# Patient Record
Sex: Male | Born: 1950 | Race: White | Hispanic: No | Marital: Single | Smoking: Current every day smoker
Health system: Southern US, Community
[De-identification: ages and names within clinical notes are randomized; demographics above are authoritative.]

## PROBLEM LIST (undated history)

## (undated) DIAGNOSIS — F329 Major depressive disorder, single episode, unspecified: Secondary | ICD-10-CM

## (undated) DIAGNOSIS — F32A Depression, unspecified: Secondary | ICD-10-CM

## (undated) DIAGNOSIS — E0590 Thyrotoxicosis, unspecified without thyrotoxic crisis or storm: Secondary | ICD-10-CM

---

## 2018-04-22 ENCOUNTER — Emergency Department (HOSPITAL_COMMUNITY)
Admission: EM | Admit: 2018-04-22 | Discharge: 2018-04-24 | Disposition: A | Discharge status: Other Acute Inpatient Hospital | Payer: Medicare (Managed Care) | Attending: Emergency Medicine | Admitting: Emergency Medicine

## 2018-04-22 ENCOUNTER — Encounter (HOSPITAL_COMMUNITY): Payer: Self-pay | Admitting: Emergency Medicine

## 2018-04-22 DIAGNOSIS — F314 Bipolar disorder, current episode depressed, severe, without psychotic features: Secondary | ICD-10-CM | POA: Diagnosis not present

## 2018-04-22 DIAGNOSIS — R45851 Suicidal ideations: Secondary | ICD-10-CM | POA: Insufficient documentation

## 2018-04-22 DIAGNOSIS — Z79899 Other long term (current) drug therapy: Secondary | ICD-10-CM | POA: Insufficient documentation

## 2018-04-22 DIAGNOSIS — D709 Neutropenia, unspecified: Secondary | ICD-10-CM | POA: Diagnosis not present

## 2018-04-22 DIAGNOSIS — F332 Major depressive disorder, recurrent severe without psychotic features: Secondary | ICD-10-CM | POA: Diagnosis present

## 2018-04-22 DIAGNOSIS — F329 Major depressive disorder, single episode, unspecified: Secondary | ICD-10-CM | POA: Diagnosis present

## 2018-04-22 HISTORY — DX: Major depressive disorder, single episode, unspecified: F32.9

## 2018-04-22 HISTORY — DX: Depression, unspecified: F32.A

## 2018-04-22 LAB — COMPREHENSIVE METABOLIC PANEL
ALBUMIN: 4.4 g/dL (ref 3.5–5.0)
ALT: 12 U/L (ref 0–44)
ANION GAP: 12 (ref 5–15)
AST: 20 U/L (ref 15–41)
Alkaline Phosphatase: 73 U/L (ref 38–126)
BILIRUBIN TOTAL: 0.5 mg/dL (ref 0.3–1.2)
BUN: 8 mg/dL (ref 8–23)
CHLORIDE: 98 mmol/L (ref 98–111)
CO2: 25 mmol/L (ref 22–32)
Calcium: 9.4 mg/dL (ref 8.9–10.3)
Creatinine, Ser: 0.81 mg/dL (ref 0.61–1.24)
GFR calc Af Amer: >60 mL/min (ref >60)
GFR calc non Af Amer: >60 mL/min (ref >60)
GLUCOSE: 103 mg/dL — AB (ref 70–99)
POTASSIUM: 3.8 mmol/L (ref 3.5–5.1)
SODIUM: 135 mmol/L (ref 135–145)
TOTAL PROTEIN: 7.6 g/dL (ref 6.5–8.1)

## 2018-04-22 LAB — RAPID URINE DRUG SCREEN, HOSP PERFORMED
Amphetamines: NOT DETECTED
BARBITURATES: NOT DETECTED
Benzodiazepines: NOT DETECTED
COCAINE: NOT DETECTED
OPIATES: NOT DETECTED
Tetrahydrocannabinol: NOT DETECTED

## 2018-04-22 LAB — ACETAMINOPHEN LEVEL

## 2018-04-22 LAB — CBC
HCT: 36.4 % — ABNORMAL LOW (ref 39.0–52.0)
Hemoglobin: 11.8 g/dL — ABNORMAL LOW (ref 13.0–17.0)
MCH: 25.3 pg — ABNORMAL LOW (ref 26.0–34.0)
MCHC: 32.4 g/dL (ref 30.0–36.0)
MCV: 78.1 fL (ref 78.0–100.0)
PLATELETS: 261 10*3/uL (ref 150–400)
RBC: 4.66 MIL/uL (ref 4.22–5.81)
RDW: 17.1 % — AB (ref 11.5–15.5)
WBC: 2.2 10*3/uL — ABNORMAL LOW (ref 4.0–10.5)

## 2018-04-22 LAB — ETHANOL

## 2018-04-22 LAB — SALICYLATE LEVEL: Salicylate Lvl: <7 mg/dL (ref 2.8–30.0)

## 2018-04-22 LAB — I-STAT TROPONIN, ED: Troponin i, poc: 0.01 ng/mL (ref 0.00–0.08)

## 2018-04-22 MED ORDER — CITALOPRAM HYDROBROMIDE 10 MG PO TABS
40.0000 mg | ORAL_TABLET | Freq: Every day | ORAL | Status: DC
  Administered 2018-04-22 – 2018-04-23 (×2): 40 mg via ORAL
  Filled 2018-04-22 (×2): qty 4

## 2018-04-22 MED ORDER — MIRTAZAPINE 30 MG PO TABS
15.0000 mg | ORAL_TABLET | Freq: Every day | ORAL | Status: DC
  Administered 2018-04-22 – 2018-04-23 (×2): 15 mg via ORAL
  Filled 2018-04-22 (×2): qty 1

## 2018-04-22 MED ORDER — LISINOPRIL 20 MG PO TABS
20.0000 mg | ORAL_TABLET | Freq: Every day | ORAL | Status: DC
  Administered 2018-04-23 – 2018-04-24 (×2): 20 mg via ORAL
  Filled 2018-04-22 (×2): qty 1

## 2018-04-22 MED ORDER — TRAMADOL HCL 50 MG PO TABS
50.0000 mg | ORAL_TABLET | Freq: Three times a day (TID) | ORAL | Status: DC | PRN
  Administered 2018-04-22 – 2018-04-24 (×3): 50 mg via ORAL
  Filled 2018-04-22 (×3): qty 1

## 2018-04-22 MED ORDER — LEVOTHYROXINE SODIUM 100 MCG PO TABS
100.0000 ug | ORAL_TABLET | Freq: Every day | ORAL | Status: DC
  Administered 2018-04-23 – 2018-04-24 (×2): 100 ug via ORAL
  Filled 2018-04-22 (×2): qty 1

## 2018-04-22 MED ORDER — DICLOFENAC SODIUM 50 MG PO TBEC
50.0000 mg | DELAYED_RELEASE_TABLET | Freq: Two times a day (BID) | ORAL | Status: DC
  Administered 2018-04-22 – 2018-04-24 (×4): 50 mg via ORAL
  Filled 2018-04-22 (×5): qty 1

## 2018-04-22 MED ORDER — PANTOPRAZOLE SODIUM 40 MG PO TBEC
40.0000 mg | DELAYED_RELEASE_TABLET | Freq: Two times a day (BID) | ORAL | Status: DC
  Administered 2018-04-22 – 2018-04-24 (×4): 40 mg via ORAL
  Filled 2018-04-22 (×4): qty 1

## 2018-04-22 MED ORDER — GABAPENTIN 400 MG PO CAPS
800.0000 mg | ORAL_CAPSULE | Freq: Three times a day (TID) | ORAL | Status: DC
  Administered 2018-04-22 – 2018-04-24 (×7): 800 mg via ORAL
  Filled 2018-04-22 (×9): qty 2

## 2018-04-22 MED ORDER — GABAPENTIN 800 MG PO TABS
800.0000 mg | ORAL_TABLET | Freq: Three times a day (TID) | ORAL | Status: DC
  Filled 2018-04-22 (×2): qty 1

## 2018-04-22 NOTE — ED Notes (Signed)
Pt stated "I'm from [place], went to [place] with a girl but that didn't work out, she kicked me out, and have been in a halfway house here with 5 guys.  There's more drugs and alochol in there than outside.  I don't have any place to go.  I have an ulcer."

## 2018-04-22 NOTE — ED Notes (Signed)
Pt requesting home meds to include gabapentin, tramadol, etc. Dr. Adela LankFloyd made aware. New orders placed.

## 2018-04-22 NOTE — ED Provider Notes (Signed)
[facility]-EMERGENCY DEPT Provider Note   CSN: [id number] Arrival date & time: 04/22/18  0759     History   Chief Complaint Chief Complaint  Patient presents with  . Suicidal    HPI Walter Nicholson is a 67 y.o. male.  Patient is a 67 year old male with a history of depression who presents with worsening depression and suicidal ideations.  He states he is been markedly worse regarding his depression over the last 2 to 3 weeks.  He feels like he has no will to live.  He is not sleeping or sleeping for 14 hours a day.  He says he is not eating very well.  He is currently living with 5 other men and says that he can go back to that environment.  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  He feels overall anxious and jittery.  He does complain of some chest pain which he says goes on all day every day but denies any other physical complaints or recent illnesses.     Past Medical History:  Diagnosis Date  . Depression     There are no active problems to display for this patient.    The histories are not reviewed yet. Please review them in the "History" navigator section and refresh this SmartLink.      Home Medications    Prior to Admission medications   Medication Sig Start Date End Date Taking? Authorizing Provider  calcium carbonate (TUMS EX) 750 MG chewable tablet Chew 1 tablet by mouth daily as needed for heartburn.   Yes [provider]  citalopram (CELEXA) 40 MG tablet Take 40 mg by mouth daily.   Yes [provider]  diclofenac (VOLTAREN) 50 MG EC tablet Take 50 mg by mouth 2 (two) times daily.   Yes [provider]  gabapentin (NEURONTIN) 800 MG tablet Take 800 mg by mouth 3 (three) times daily.   Yes [provider]  lamoTRIgine (LAMICTAL) 100 MG tablet Take 100 mg by mouth 2 (two) times daily.   Yes [provider]  levothyroxine (SYNTHROID, LEVOTHROID) 100 MCG  tablet Take 100 mcg by mouth daily before breakfast.   Yes [provider]  lisinopril (PRINIVIL,ZESTRIL) 20 MG tablet Take 20 mg by mouth daily.   Yes [provider]  mirtazapine (REMERON) 15 MG tablet Take 15 mg by mouth at bedtime.   Yes [provider]  Multiple Vitamins-Minerals (CENTRUM ADULTS PO) Take 1 tablet by mouth daily.   Yes [provider]  naproxen sodium (ALEVE) 220 MG tablet Take 440 mg by mouth daily as needed (for pain or headache).   Yes [provider]  pantoprazole (PROTONIX) 40 MG tablet Take 40 mg by mouth 2 (two) times daily.   Yes [provider]  sucralfate (CARAFATE) 1 g tablet Take 1 g by mouth 4 (four) times daily -  with meals and at bedtime.   Yes [provider]  traMADol (ULTRAM) 50 MG tablet Take 50 mg by mouth 3 (three) times daily as needed for moderate pain.   Yes [provider]    Family History No family history on file.  Social History Social History   Tobacco Use  . Smoking status: Not on file  Substance Use Topics  . Alcohol use: Yes  . Drug use: Not on file     Allergies   Patient has no known allergies.   Review of Systems Review  of Systems  Constitutional: Positive for fatigue. Negative for chills, diaphoresis and fever.  HENT: Negative for congestion, rhinorrhea and sneezing.   Eyes: Negative.   Respiratory: Negative for cough, chest tightness and shortness of breath.   Cardiovascular: Positive for chest pain. Negative for leg swelling.  Gastrointestinal: Negative for abdominal pain, blood in stool, diarrhea, nausea and vomiting.  Genitourinary: Negative for difficulty urinating, flank pain, frequency and hematuria.  Musculoskeletal: Negative for arthralgias and back pain.  Skin: Negative for rash.  Neurological: Negative for dizziness, speech difficulty, weakness, numbness and headaches.  Psychiatric/Behavioral: Positive for dysphoric mood and suicidal  ideas. The patient is nervous/anxious.      Physical Exam Updated Vital Signs BP 140/77 (BP Location: Right Arm)   Pulse (!) 105   Temp 98.1 F (36.7 C) (Oral)   Resp 20   SpO2 97%   Physical Exam  Constitutional: He is oriented to person, place, and time. He appears well-developed and well-nourished.  HENT:  Head: Normocephalic and atraumatic.  Eyes: Pupils are equal, round, and reactive to light.  Neck: Normal range of motion. Neck supple.  Cardiovascular: Normal rate, regular rhythm and normal heart sounds.  Pulmonary/Chest: Effort normal and breath sounds normal. No respiratory distress. He has no wheezes. He has no rales. He exhibits no tenderness.  Abdominal: Soft. Bowel sounds are normal. There is no tenderness. There is no rebound and no guarding.  Musculoskeletal: Normal range of motion. He exhibits no edema.  Lymphadenopathy:    He has no cervical adenopathy.  Neurological: He is alert and oriented to person, place, and time.  Skin: Skin is warm and dry. No rash noted.  Psychiatric: He has a normal mood and affect.     ED Treatments / Results  Labs (all labs ordered are listed, but only abnormal results are displayed) Labs Reviewed  COMPREHENSIVE METABOLIC PANEL - Abnormal; Notable for the following components:      Result Value   Glucose, Bld 103 (*)    All other components within normal limits  ACETAMINOPHEN LEVEL - Abnormal; Notable for the following components:   Acetaminophen (Tylenol), Serum <10 (*)    All other components within normal limits  CBC - Abnormal; Notable for the following components:   WBC 2.2 (*)    Hemoglobin 11.8 (*)    HCT 36.4 (*)    MCH 25.3 (*)    RDW 17.1 (*)    All other components within normal limits  ETHANOL  SALICYLATE LEVEL  RAPID URINE DRUG SCREEN, HOSP PERFORMED  I-STAT TROPONIN, ED    EKG EKG Interpretation  Date/Time:  Sunday April 22 2018 09:24:32 EDT Ventricular Rate:  81 PR Interval:    QRS  Duration: 83 QT Interval:  399 QTC Calculation: 464 R Axis:   74 Text Interpretation:  Sinus rhythm Consider left ventricular hypertrophy No old tracing to compare Confirmed by Rolan Bucco [phone]) on 04/22/2018 9:33:13 AM   Radiology No results found.  Procedures Procedures (including critical care time)  Medications Ordered in ED Medications - No data to display   Initial Impression / Assessment and Plan / ED Course  I have reviewed the triage vital signs and the nursing notes.  Pertinent labs & imaging results that were available during my care of the patient were reviewed by me and considered in my medical decision making (see chart for details).     PT is medically cleared and awaiting TTS evaluation.  He will need outpatient follow-up regarding his leukopenia.  This may be related to his ETOH use.   Final Clinical Impressions(s) / ED Diagnoses   Final diagnoses:  Suicidal thoughts  Neutropenia, unspecified type [facility](HCC)    ED Discharge Orders    None       Rolan BuccoBelfi, Dorleen Kissel, MD 04/22/18 1208

## 2018-04-22 NOTE — ED Notes (Signed)
Pt stated "It would be really easy to get a sharp knife and slice my throat."

## 2018-04-22 NOTE — ED Notes (Signed)
Bed: WA28 Expected date:  Expected time:  Means of arrival:  Comments: 

## 2018-04-22 NOTE — ED Notes (Signed)
Pt up OOB, unsteady when attempting to move to recliner.

## 2018-04-22 NOTE — ED Notes (Signed)
Bed: [facility] Expected date:  Expected time:  Means of arrival:  Comments: 

## 2018-04-22 NOTE — BHH Counselor (Signed)
Dr. Jannifer FranklinAkintayo and Elta GuadeloupeLaurie Parks, NP, recommend observe overnight and reassess in the morning.
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
## 2018-04-22 NOTE — ED Triage Notes (Signed)
Pt brought in by [employer], pt had called police stating he was suicidal and depressed. Pt states he will overdose on drugs / alcohol or jump into traffic. Pt calm and cooperative in triage.

## 2018-04-23 ENCOUNTER — Encounter (HOSPITAL_COMMUNITY): Payer: Self-pay | Admitting: Emergency Medicine

## 2018-04-23 DIAGNOSIS — F332 Major depressive disorder, recurrent severe without psychotic features: Secondary | ICD-10-CM | POA: Diagnosis present

## 2018-04-23 DIAGNOSIS — R45851 Suicidal ideations: Secondary | ICD-10-CM

## 2018-04-23 MED ORDER — CITALOPRAM HYDROBROMIDE 10 MG PO TABS
20.0000 mg | ORAL_TABLET | Freq: Every day | ORAL | Status: DC
  Administered 2018-04-24: 20 mg via ORAL
  Filled 2018-04-23: qty 2

## 2018-04-23 NOTE — Consult Note (Signed)
[facility] Consult   Reason for Consult:  Suicidal ideations Referring Physician:  EDP Patient Identification: Walter Nicholson MRN:  [id number] Principal Diagnosis: Major depressive disorder, recurrent, severe without psychotic features [facility]) Diagnosis:   Patient Active Problem List   Diagnosis Date Noted  . Major depressive disorder, recurrent, severe without psychotic features ([place]) [F33.2] 04/23/2018    Priority: High    Total Time spent with patient: 45 minutes  Subjective:   Walter Nicholson is a 67 y.o. male patient admitted with suicidal ideations.  HPI:  67 yo male who presented to the ED with suicidal ideations, vague plan.  His depression started when he moved to [place] to a recovery house a few days ago and does not like it, feels like prison.  Feelings of hopelessness, 8/10 depression.  Past substance abuse but none currently.  Patient calmly watching television and eating without distress.  Big issue appears to be housing, discussed looking for a geriatric psych today and may need to consider outpatient tomorrow if a place not secured.  Past Psychiatric History: depression, substance abuse  Risk to Self: None Risk to Others: Homicidal Ideation: No Thoughts of Harm to Others: No Current Homicidal Intent: No Current Homicidal Plan: No Access to Homicidal Means: No Identified Victim: n/a History of harm to others?: No Assessment of Violence: None Noted Violent Behavior Description: None Noted  Does patient have access to weapons?: No Criminal Charges Pending?: No Does patient have a court date: No Prior Inpatient Therapy: Prior Inpatient Therapy: Yes Prior Therapy Dates: report hx of inpt starting in 1993 Prior Therapy Facilty/Provider(s): multiple  Reason for Treatment: mental health, depression, Bipolar  Prior Outpatient Therapy: Prior Outpatient Therapy: No Does patient have an ACCT team?: No Does patient have Intensive In-House Services?  :  No Does patient have [facility] services? : No Does patient have P4CC services?: No  Past Medical History:  Past Medical History:  Diagnosis Date  . Depression   Family History: No family history on file. Family Psychiatric  History: none Social History:  Social History   Substance and Sexual Activity  Alcohol Use Yes     Social History   Substance and Sexual Activity  Drug Use Not on file    Social History   Socioeconomic History  . Marital status: Single    Spouse name: Not on file  . Number of children: Not on file  . Years of education: Not on file  . Highest education level: Not on file  Occupational History  . Not on file  Social Needs  . Financial resource strain: Not on file  . Food insecurity:    Worry: Not on file    Inability: Not on file  . Transportation needs:    Medical: Not on file    Non-medical: Not on file  Tobacco Use  . Smoking status: Not on file  Substance and Sexual Activity  . Alcohol use: Yes  . Drug use: Not on file  . Sexual activity: Not on file  Lifestyle  . Physical activity:    Days per week: Not on file    Minutes per session: Not on file  . Stress: Not on file  Relationships  . Social connections:    Talks on phone: Not on file    Gets together: Not on file    Attends religious service: Not on file    Active member of club or organization: Not on file    Attends meetings of clubs  or organizations: Not on file    Relationship status: Not on file  Other Topics Concern  . Not on file  Social History Narrative  . Not on file   Additional Social History:    Allergies:  No Known Allergies  Labs:  Results for orders placed or performed during the hospital encounter of 04/22/18 (from the past 48 hour(s))  Rapid urine drug screen (hospital performed)     Status: None   Collection Time: 04/22/18  8:28 AM  Result Value Ref Range   Opiates NONE DETECTED NONE DETECTED   Cocaine NONE DETECTED NONE DETECTED   Benzodiazepines  NONE DETECTED NONE DETECTED   Amphetamines NONE DETECTED NONE DETECTED   Tetrahydrocannabinol NONE DETECTED NONE DETECTED   Barbiturates NONE DETECTED NONE DETECTED    Comment: (NOTE) DRUG SCREEN FOR MEDICAL PURPOSES ONLY.  IF CONFIRMATION IS NEEDED FOR ANY PURPOSE, NOTIFY LAB WITHIN 5 DAYS. LOWEST DETECTABLE LIMITS FOR URINE DRUG SCREEN Drug Class                     Cutoff (ng/mL) Amphetamine and metabolites    1000 Barbiturate and metabolites    200 Benzodiazepine                 254 Tricyclics and metabolites     300 Opiates and metabolites        300 Cocaine and metabolites        300 THC                            50 Performed at [facility], [place] [address], [place], [place] [address]   Comprehensive metabolic panel     Status: Abnormal   Collection Time: 04/22/18  9:14 AM  Result Value Ref Range   Sodium 135 135 - 145 mmol/L   Potassium 3.8 3.5 - 5.1 mmol/L   Chloride 98 98 - 111 mmol/L   CO2 25 22 - 32 mmol/L   Glucose, Bld 103 (H) 70 - 99 mg/dL   BUN 8 8 - 23 mg/dL   Creatinine, Ser 0.81 0.61 - 1.24 mg/dL   Calcium 9.4 8.9 - 10.3 mg/dL   Total Protein 7.6 6.5 - 8.1 g/dL   Albumin 4.4 3.5 - 5.0 g/dL   AST 20 15 - 41 U/L   ALT 12 0 - 44 U/L   Alkaline Phosphatase 73 38 - 126 U/L   Total Bilirubin 0.5 0.3 - 1.2 mg/dL   GFR calc non Af Amer >60 >60 mL/min   GFR calc Af Amer >60 >60 mL/min    Comment: (NOTE) The eGFR has been calculated using the CKD EPI equation. This calculation has not been validated in all clinical situations. eGFR's persistently <60 mL/min signify possible Chronic Kidney Disease.    Anion gap 12 5 - 15    Comment: Performed at [facility], [place] [address] [place], [place] [address]  Ethanol     Status: None   Collection Time: 04/22/18  9:14 AM  Result Value Ref Range   Alcohol, Ethyl (B) <10 <10 mg/dL    Comment: (NOTE) Lowest detectable limit for serum alcohol is 10 mg/dL. For medical purposes  only. Performed at [facility], [place] [address], [place], [place] [address]   Salicylate level     Status: None   Collection Time: 04/22/18  9:14 AM  Result Value Ref Range   Salicylate  Lvl <7.0 2.8 - 30.0 mg/dL    Comment: Performed at [facility], [place] [address], [place], [place] [address]  Acetaminophen level     Status: Abnormal   Collection Time: 04/22/18  9:14 AM  Result Value Ref Range   Acetaminophen (Tylenol), Serum <10 (L) 10 - 30 ug/mL    Comment: (NOTE) Therapeutic concentrations vary significantly. A range of 10-30 ug/mL  may be an effective concentration for many patients. However, some  are best treated at concentrations outside of this range. Acetaminophen concentrations >150 ug/mL at 4 hours after ingestion  and >50 ug/mL at 12 hours after ingestion are often associated with  toxic reactions. Performed at [facility], [place] [address] [place], [place] [address]   cbc     Status: Abnormal   Collection Time: 04/22/18  9:14 AM  Result Value Ref Range   WBC 2.2 (L) 4.0 - 10.5 K/uL   RBC 4.66 4.22 - 5.81 MIL/uL   Hemoglobin 11.8 (L) 13.0 - 17.0 g/dL   HCT 36.4 (L) 39.0 - 52.0 %   MCV 78.1 78.0 - 100.0 fL   MCH 25.3 (L) 26.0 - 34.0 pg   MCHC 32.4 30.0 - 36.0 g/dL   RDW 17.1 (H) 11.5 - 15.5 %   Platelets 261 150 - 400 K/uL    Comment: Performed at [facility], [place] [address] [place], [place] [address]  I-stat troponin, ED     Status: None   Collection Time: 04/22/18  9:21 AM  Result Value Ref Range   Troponin i, poc 0.01 0.00 - 0.08 ng/mL   Comment 3            Comment: Due to the release kinetics of cTnI, a negative result within the first hours of the onset of symptoms does not rule out myocardial infarction with certainty. If myocardial infarction is still suspected, repeat the test at appropriate intervals.     Current Facility-Administered Medications  Medication Dose Route  Frequency Provider Last Rate Last Dose  . [START ON 04/24/2018] citalopram (CELEXA) tablet 20 mg  20 mg Oral Daily Patrecia Pour, NP      . diclofenac (VOLTAREN) EC tablet 50 mg  50 mg Oral BID Deno Etienne, DO   50 mg at 04/23/18 9147  . gabapentin (NEURONTIN) capsule 800 mg  800 mg Oral TID Malvin Johns, MD   800 mg at 04/23/18 0833  . levothyroxine (SYNTHROID, LEVOTHROID) tablet 100 mcg  100 mcg Oral QAC breakfast Deno Etienne, DO   100 mcg at 04/23/18 8295  . lisinopril (PRINIVIL,ZESTRIL) tablet 20 mg  20 mg Oral Daily Deno Etienne, DO   20 mg at 04/23/18 0834  . mirtazapine (REMERON) tablet 15 mg  15 mg Oral QHS Deno Etienne, DO   15 mg at 04/22/18 2147  . pantoprazole (PROTONIX) EC tablet 40 mg  40 mg Oral BID Deno Etienne, DO   40 mg at 04/23/18 0834  . traMADol (ULTRAM) tablet 50 mg  50 mg Oral TID PRN Deno Etienne, DO   50 mg at 04/22/18 1730   Current Outpatient Medications  Medication Sig Dispense Refill  . calcium carbonate (TUMS EX) 750 MG chewable tablet Chew 1 tablet by mouth daily as needed for heartburn.    . citalopram (CELEXA) 40 MG tablet Take 40 mg by mouth daily.    . diclofenac (VOLTAREN) 50 MG EC tablet Take 50 mg by mouth 2 (two) times daily.    Marland Kitchen  gabapentin (NEURONTIN) 800 MG tablet Take 800 mg by mouth 3 (three) times daily.    Marland Kitchen lamoTRIgine (LAMICTAL) 100 MG tablet Take 100 mg by mouth 2 (two) times daily.    Marland Kitchen levothyroxine (SYNTHROID, LEVOTHROID) 100 MCG tablet Take 100 mcg by mouth daily before breakfast.    . lisinopril (PRINIVIL,ZESTRIL) 20 MG tablet Take 20 mg by mouth daily.    . mirtazapine (REMERON) 15 MG tablet Take 15 mg by mouth at bedtime.    . Multiple Vitamins-Minerals (CENTRUM ADULTS PO) Take 1 tablet by mouth daily.    . naproxen sodium (ALEVE) 220 MG tablet Take 440 mg by mouth daily as needed (for pain or headache).    . pantoprazole (PROTONIX) 40 MG tablet Take 40 mg by mouth 2 (two) times daily.    . sucralfate (CARAFATE) 1 g tablet Take 1 g by mouth 4  (four) times daily -  with meals and at bedtime.    . traMADol (ULTRAM) 50 MG tablet Take 50 mg by mouth 3 (three) times daily as needed for moderate pain.      Musculoskeletal: Strength & Muscle Tone: within normal limits Gait & Station: normal Patient leans: N/A  Psychiatric Specialty Exam: Physical Exam  Nursing note and vitals reviewed. Constitutional: He is oriented to person, place, and time. He appears well-developed and well-nourished.  HENT:  Head: Normocephalic.  Neck: Normal range of motion.  Respiratory: Effort normal.  Musculoskeletal: Normal range of motion.  Neurological: He is alert and oriented to person, place, and time.  Psychiatric: His speech is normal and behavior is normal. Judgment normal. His mood appears anxious. He exhibits a depressed mood. He expresses suicidal ideation. He expresses suicidal plans.    Review of Systems  Psychiatric/Behavioral: Positive for depression and suicidal ideas. The patient is nervous/anxious.   All other systems reviewed and are negative.   Blood pressure (!) 167/80, pulse 68, temperature 97.8 F (36.6 C), temperature source Oral, resp. rate 18, SpO2 97 %.There is no height or weight on file to calculate BMI.  General Appearance: Casual  Eye Contact:  Good  Speech:  Normal Rate  Volume:  Normal  Mood:  Anxious and Depressed  Affect:  Congruent  Thought Process:  Coherent and Descriptions of Associations: Intact  Orientation:  Full (Time, Place, and Person)  Thought Content:  Rumination  Suicidal Thoughts:  Yes.  without intent/plan  Homicidal Thoughts:  No  Memory:  Immediate;   Fair Recent;   Fair Remote;   Fair  Judgement:  Fair  Insight:  Fair  Psychomotor Activity:  Decreased  Concentration:  Concentration: Fair and Attention Span: Fair  Recall:  Good  Fund of Knowledge:  Fair  Language:  Good  Akathisia:  No  Handed:  Right  AIMS (if indicated):     Assets:  Leisure Time Physical Health Resilience   ADL's:  Intact  Cognition:  WNL  Sleep:        Treatment Plan Summary: Daily contact with patient to assess and evaluate symptoms and progress in treatment, Medication management and Plan major depressive disorder, recurrent, severe without psychosis: -Decreased Celexa from 40 mg to 20 mg daily -Continued Remeron 15 mg daily at bedtime for depression and sleep  Disposition: Recommend psychiatric Inpatient admission when medically cleared.  Waylan Boga, NP 04/23/2018 1:46 PM

## 2018-04-24 ENCOUNTER — Encounter (HOSPITAL_COMMUNITY): Payer: Self-pay | Admitting: *Deleted

## 2018-04-24 ENCOUNTER — Emergency Department (HOSPITAL_COMMUNITY): Payer: Medicare (Managed Care)

## 2018-04-24 ENCOUNTER — Other Ambulatory Visit: Payer: Self-pay

## 2018-04-24 ENCOUNTER — Inpatient Hospital Stay (HOSPITAL_COMMUNITY)
Admission: AD | Admit: 2018-04-24 | Discharge: 2018-05-01 | DRG: 885 | Disposition: A | Discharge status: Home / Self Care | Payer: Medicare (Managed Care) | Source: Intra-hospital | Attending: Psychiatry | Admitting: Psychiatry

## 2018-04-24 DIAGNOSIS — E039 Hypothyroidism, unspecified: Secondary | ICD-10-CM | POA: Diagnosis present

## 2018-04-24 DIAGNOSIS — F17210 Nicotine dependence, cigarettes, uncomplicated: Secondary | ICD-10-CM | POA: Diagnosis present

## 2018-04-24 DIAGNOSIS — F314 Bipolar disorder, current episode depressed, severe, without psychotic features: Principal | ICD-10-CM | POA: Diagnosis present

## 2018-04-24 DIAGNOSIS — K219 Gastro-esophageal reflux disease without esophagitis: Secondary | ICD-10-CM | POA: Diagnosis present

## 2018-04-24 DIAGNOSIS — G4700 Insomnia, unspecified: Secondary | ICD-10-CM | POA: Diagnosis present

## 2018-04-24 DIAGNOSIS — F1020 Alcohol dependence, uncomplicated: Secondary | ICD-10-CM | POA: Diagnosis present

## 2018-04-24 DIAGNOSIS — R7989 Other specified abnormal findings of blood chemistry: Secondary | ICD-10-CM

## 2018-04-24 DIAGNOSIS — I10 Essential (primary) hypertension: Secondary | ICD-10-CM | POA: Diagnosis present

## 2018-04-24 DIAGNOSIS — Z79899 Other long term (current) drug therapy: Secondary | ICD-10-CM | POA: Diagnosis not present

## 2018-04-24 DIAGNOSIS — Z915 Personal history of self-harm: Secondary | ICD-10-CM | POA: Diagnosis not present

## 2018-04-24 DIAGNOSIS — R45851 Suicidal ideations: Secondary | ICD-10-CM | POA: Diagnosis present

## 2018-04-24 DIAGNOSIS — F332 Major depressive disorder, recurrent severe without psychotic features: Secondary | ICD-10-CM | POA: Diagnosis not present

## 2018-04-24 DIAGNOSIS — Z79890 Hormone replacement therapy (postmenopausal): Secondary | ICD-10-CM

## 2018-04-24 DIAGNOSIS — F419 Anxiety disorder, unspecified: Secondary | ICD-10-CM | POA: Diagnosis present

## 2018-04-24 DIAGNOSIS — Z23 Encounter for immunization: Secondary | ICD-10-CM | POA: Diagnosis not present

## 2018-04-24 DIAGNOSIS — R451 Restlessness and agitation: Secondary | ICD-10-CM | POA: Diagnosis not present

## 2018-04-24 HISTORY — DX: Thyrotoxicosis, unspecified without thyrotoxic crisis or storm: E05.90

## 2018-04-24 LAB — URINALYSIS, ROUTINE W REFLEX MICROSCOPIC
Bilirubin Urine: NEGATIVE
GLUCOSE, UA: NEGATIVE mg/dL
HGB URINE DIPSTICK: NEGATIVE
Ketones, ur: NEGATIVE mg/dL
Leukocytes, UA: NEGATIVE
Nitrite: NEGATIVE
Protein, ur: NEGATIVE mg/dL
SPECIFIC GRAVITY, URINE: 1.005 (ref 1.005–1.030)
pH: 7 (ref 5.0–8.0)

## 2018-04-24 MED ORDER — CITALOPRAM HYDROBROMIDE 20 MG PO TABS
20.0000 mg | ORAL_TABLET | Freq: Every day | ORAL | Status: DC
  Administered 2018-04-25: 20 mg via ORAL
  Filled 2018-04-24 (×4): qty 1

## 2018-04-24 MED ORDER — INFLUENZA VAC SPLIT HIGH-DOSE 0.5 ML IM SUSY
0.5000 mL | PREFILLED_SYRINGE | INTRAMUSCULAR | Status: AC
  Administered 2018-04-26: 0.5 mL via INTRAMUSCULAR
  Filled 2018-04-24: qty 0.5

## 2018-04-24 MED ORDER — PNEUMOCOCCAL VAC POLYVALENT 25 MCG/0.5ML IJ INJ
0.5000 mL | INJECTION | INTRAMUSCULAR | Status: AC
  Administered 2018-04-26: 0.5 mL via INTRAMUSCULAR

## 2018-04-24 MED ORDER — LAMOTRIGINE 200 MG PO TABS
200.0000 mg | ORAL_TABLET | Freq: Two times a day (BID) | ORAL | Status: DC
  Administered 2018-04-24 – 2018-05-01 (×14): 200 mg via ORAL
  Filled 2018-04-24 (×2): qty 1
  Filled 2018-04-24 (×2): qty 2
  Filled 2018-04-24 (×16): qty 1

## 2018-04-24 MED ORDER — ENSURE ENLIVE PO LIQD
237.0000 mL | Freq: Two times a day (BID) | ORAL | Status: DC
  Administered 2018-04-25: 237 mL via ORAL

## 2018-04-24 MED ORDER — MAGNESIUM HYDROXIDE 400 MG/5ML PO SUSP: 30.0000 mL | Freq: Every day | ORAL | Status: DC | PRN

## 2018-04-24 MED ORDER — NICOTINE 21 MG/24HR TD PT24
21.0000 mg | MEDICATED_PATCH | Freq: Every day | TRANSDERMAL | Status: DC
  Administered 2018-04-25 – 2018-05-01 (×7): 21 mg via TRANSDERMAL
  Filled 2018-04-24 (×10): qty 1

## 2018-04-24 MED ORDER — ACETAMINOPHEN 325 MG PO TABS
650.0000 mg | ORAL_TABLET | Freq: Four times a day (QID) | ORAL | Status: DC | PRN
  Administered 2018-04-24 – 2018-04-30 (×6): 650 mg via ORAL
  Filled 2018-04-24 (×6): qty 2

## 2018-04-24 MED ORDER — ALUM & MAG HYDROXIDE-SIMETH 200-200-20 MG/5ML PO SUSP: 30.0000 mL | ORAL | Status: DC | PRN

## 2018-04-24 MED ORDER — LISINOPRIL 20 MG PO TABS
20.0000 mg | ORAL_TABLET | Freq: Every day | ORAL | Status: DC
  Administered 2018-04-25 – 2018-05-01 (×7): 20 mg via ORAL
  Filled 2018-04-24 (×10): qty 1

## 2018-04-24 MED ORDER — PANTOPRAZOLE SODIUM 40 MG PO TBEC
40.0000 mg | DELAYED_RELEASE_TABLET | Freq: Two times a day (BID) | ORAL | Status: DC
  Administered 2018-04-24 – 2018-05-01 (×14): 40 mg via ORAL
  Filled 2018-04-24 (×23): qty 1

## 2018-04-24 MED ORDER — MIRTAZAPINE 15 MG PO TABS
15.0000 mg | ORAL_TABLET | Freq: Every day | ORAL | Status: DC
  Administered 2018-04-24: 15 mg via ORAL
  Filled 2018-04-24 (×4): qty 1

## 2018-04-24 MED ORDER — GABAPENTIN 400 MG PO CAPS
800.0000 mg | ORAL_CAPSULE | Freq: Three times a day (TID) | ORAL | Status: DC
  Administered 2018-04-24 – 2018-05-01 (×21): 800 mg via ORAL
  Filled 2018-04-24 (×28): qty 2

## 2018-04-24 MED ORDER — LEVOTHYROXINE SODIUM 100 MCG PO TABS
100.0000 ug | ORAL_TABLET | Freq: Every day | ORAL | Status: DC
  Administered 2018-04-25 – 2018-04-28 (×4): 100 ug via ORAL
  Filled 2018-04-24 (×6): qty 1

## 2018-04-24 MED ORDER — LAMOTRIGINE 100 MG PO TABS
200.0000 mg | ORAL_TABLET | Freq: Two times a day (BID) | ORAL | Status: DC
  Administered 2018-04-24: 200 mg via ORAL
  Filled 2018-04-24: qty 2

## 2018-04-24 NOTE — Progress Notes (Signed)
D: Pt was in dayroom upon initial approach.  Pt presents with depressed affect and mood.  He describes his day as "okay" and reports goal is to "maybe find long-term treatment."  Pt denies SI/HI, denies hallucinations, reports chronic back pain of 7/10.  Pt has been visible in milieu interacting with peers and staff appropriately.  Pt attended evening group.    A: Introduced self to pt.  Met with pt 1:1.  Actively listened to pt and offered support and encouragement. Medications administered per order.  PRN medication administered for pain.  Q15 minute safety checks maintained.  R: Pt is safe on the unit.  Pt is compliant with medications.  Pt verbally contracts for safety.  Will continue to monitor and assess.

## 2018-04-24 NOTE — BH Assessment (Addendum)
[facility]     Per Dr Sharma [name], patient continues to meet inpatient treatment criteria. [facility] can be admitted after 5 pm.

## 2018-04-24 NOTE — BHH Group Notes (Signed)
Adult Psychoeducational Group Note  Date:  04/24/2018 Time:  10:23 PM  Group Topic/Focus:  Wrap-Up Group:   The focus of this group is to help patients review their daily goal of treatment and discuss progress on daily workbooks.  Participation Level:  Active  Participation Quality:  Appropriate and Attentive  Affect:  Appropriate  Cognitive:  Alert and Appropriate  Insight: Appropriate and Good  Engagement in Group:  Engaged  Modes of Intervention:  Discussion and Education  Additional Comments:  Pt attended and participated in wrap up group this evening.Pt told writer that they have been depressed and they have been meditating to help with their depression. Pt goal is to use the resources that we have here to get in to a program that is a better fit for them.    Walter NettersOctavia A Niema Nicholson 04/24/2018, 10:23 PM

## 2018-04-24 NOTE — Progress Notes (Addendum)
[facility]  04/24/2018 11:37 AM Walter ParisJames G Stewart  MRN:  [id number] Subjective:  Walter Nicholson is a 67 y.o. male patient admitted with suicidal ideations.   Objective: Patient assessed and observed with treatment team. At this time he continues to endorse ongoing depression and suicidal thoughts. He reports his biggest stressors are substance abuse and housing.  He was discharged from substance abuse home to a halfway house. He reports his conditions continue to get worse as there are numerous drugs in the halfway house. He is eating and drinking with no disturbances, and he denies any sleeping disturbances at this time. He is observed sitting up in the chair, and is responding appropriately to staff.   Principal Problem: Major depressive disorder, recurrent, severe without psychotic features ([facility]) Diagnosis:   Patient Active Problem List   Diagnosis Date Noted  . Major depressive disorder, recurrent, severe without psychotic features ([facility]) [F33.2] 04/23/2018   Total Time spent with patient: 20 minutes  Past Psychiatric History: depression, substance abuse  Past Medical History:  Past Medical History:  Diagnosis Date  . Depression    History reviewed. No pertinent surgical history. Family History: History reviewed. No pertinent family history. Family Psychiatric  History: See HPI Social History:  Social History   Substance and Sexual Activity  Alcohol Use Yes     Social History   Substance and Sexual Activity  Drug Use Not on file    Social History   Socioeconomic History  . Marital status: Single    Spouse name: Not on file  . Number of children: Not on file  . Years of education: Not on file  . Highest education level: Not on file  Occupational History  . Not on file  Social Needs  . Financial resource strain: Not on file  . Food insecurity:    Worry: Not on file    Inability: Not on file  . Transportation needs:    Medical: Not on file     Non-medical: Not on file  Tobacco Use  . Smoking status: Not on file  Substance and Sexual Activity  . Alcohol use: Yes  . Drug use: Not on file  . Sexual activity: Not on file  Lifestyle  . Physical activity:    Days per week: Not on file    Minutes per session: Not on file  . Stress: Not on file  Relationships  . Social connections:    Talks on phone: Not on file    Gets together: Not on file    Attends religious service: Not on file    Active member of club or organization: Not on file    Attends meetings of clubs or organizations: Not on file    Relationship status: Not on file  Other Topics Concern  . Not on file  Social History Narrative  . Not on file   Additional Social History:  N/A   Pain Medications: see MAR Prescriptions: see MAR Over the Counter: see MAR History of alcohol / drug use?: Yes Longest period of sobriety (when/how long): 5 weeks  Negative Consequences of Use: Personal relationships Name of Substance 1: Alcohol 1 - Age of First Use: 5 or 6  1 - Amount (size/oz): varies  1 - Frequency: early remission  1 - Last Use / Amount: 5 weeks ago                   Sleep: Fair  Appetite:  Fair  Current Medications:  Current Facility-Administered Medications  Medication Dose Route Frequency Provider Last Rate Last Dose  . citalopram (CELEXA) tablet 20 mg  20 mg Oral Daily Charm Rings, NP   20 mg at 04/24/18 0913  . diclofenac (VOLTAREN) EC tablet 50 mg  50 mg Oral BID Melene Plan, DO   50 mg at 04/24/18 0914  . gabapentin (NEURONTIN) capsule 800 mg  800 mg Oral TID Rolan Bucco, MD   800 mg at 04/24/18 0914  . levothyroxine (SYNTHROID, LEVOTHROID) tablet 100 mcg  100 mcg Oral QAC breakfast Melene Plan, DO   100 mcg at 04/24/18 0724  . lisinopril (PRINIVIL,ZESTRIL) tablet 20 mg  20 mg Oral Daily Melene Plan, DO   20 mg at 04/24/18 0913  . mirtazapine (REMERON) tablet 15 mg  15 mg Oral QHS Melene Plan, DO   15 mg at 04/23/18 2241  . pantoprazole  (PROTONIX) EC tablet 40 mg  40 mg Oral BID Melene Plan, DO   40 mg at 04/24/18 0914  . traMADol (ULTRAM) tablet 50 mg  50 mg Oral TID PRN Melene Plan, DO   50 mg at 04/24/18 0919   Current Outpatient Medications  Medication Sig Dispense Refill  . calcium carbonate (TUMS EX) 750 MG chewable tablet Chew 1 tablet by mouth daily as needed for heartburn.    . citalopram (CELEXA) 40 MG tablet Take 40 mg by mouth daily.    . diclofenac (VOLTAREN) 50 MG EC tablet Take 50 mg by mouth 2 (two) times daily.    Marland Kitchen gabapentin (NEURONTIN) 800 MG tablet Take 800 mg by mouth 3 (three) times daily.    Marland Kitchen lamoTRIgine (LAMICTAL) 100 MG tablet Take 100 mg by mouth 2 (two) times daily.    Marland Kitchen levothyroxine (SYNTHROID, LEVOTHROID) 100 MCG tablet Take 100 mcg by mouth daily before breakfast.    . lisinopril (PRINIVIL,ZESTRIL) 20 MG tablet Take 20 mg by mouth daily.    . mirtazapine (REMERON) 15 MG tablet Take 15 mg by mouth at bedtime.    . Multiple Vitamins-Minerals (CENTRUM ADULTS PO) Take 1 tablet by mouth daily.    . naproxen sodium (ALEVE) 220 MG tablet Take 440 mg by mouth daily as needed (for pain or headache).    . pantoprazole (PROTONIX) 40 MG tablet Take 40 mg by mouth 2 (two) times daily.    . sucralfate (CARAFATE) 1 g tablet Take 1 g by mouth 4 (four) times daily -  with meals and at bedtime.    . traMADol (ULTRAM) 50 MG tablet Take 50 mg by mouth 3 (three) times daily as needed for moderate pain.      Lab Results: No results found for this or any previous visit (from the past 48 hour(s)).  Blood Alcohol level:  Lab Results  Component Value Date   ETH <10 04/22/2018    Musculoskeletal: Strength & Muscle Tone: within normal limits Gait & Station: normal Patient leans: N/A  Psychiatric Specialty Exam: Physical Exam  Nursing note and vitals reviewed. Constitutional: He is oriented to person, place, and time. He appears well-developed and well-nourished.  HENT:  Head: Normocephalic and atraumatic.   Neck: Normal range of motion.  Neurological: He is alert and oriented to person, place, and time.  Psychiatric: His speech is normal and behavior is normal. Judgment and thought content normal. Cognition and memory are normal. He exhibits a depressed mood.    Review of Systems  Psychiatric/Behavioral: Positive for depression and suicidal ideas. Negative for substance abuse. The patient  does not have insomnia.   All other systems reviewed and are negative.   Blood pressure (!) 158/75, pulse 74, temperature 98.4 F (36.9 C), temperature source Oral, resp. rate 16, SpO2 100 %.There is no height or weight on file to calculate BMI.  General Appearance: Fairly Groomed and purple scrubs  Eye Contact:  Minimal  Speech:  Clear and Coherent, Slow and soft spoken  Volume:  Decreased  Mood:  Depressed and Dysphoric  Affect:  Depressed and Flat  Thought Process:  Linear and Descriptions of Associations: Intact  Orientation:  Full (Time, Place, and Person)  Thought Content:  Logical  Suicidal Thoughts:  Yes.  with intent/plan  Homicidal Thoughts:  No  Memory:  Immediate;   Fair Recent;   Fair  Judgement:  Fair  Insight:  Fair  Psychomotor Activity:  Normal  Concentration:  Concentration: Fair and Attention Span: Fair  Recall:  [employer] of Knowledge:  Fair  Language:  Fair  Akathisia:  No  Handed:  Right  AIMS (if indicated):   N/A  Assets:  Communication Skills Desire for Improvement Financial Resources/Insurance Physical Health  ADL's:  Intact  Cognition:  WNL  Sleep:   N/A     Treatment Plan Summary: Daily contact with patient to assess and evaluate symptoms and progress in treatment and Medication management  Continue to recommend inpatient, will resume home medications at this time. Patient can be admitted to traditional Inpatient, he is ambulatory and can complete his ADL's and IADL's.   -Will restart Lamictal 200 mg po BID for mood stabilization.   Truman Hayward,  FNP 04/24/2018, 11:37 AM   Patient seen face-to-face for psychiatric evaluation, chart reviewed and case discussed with the physician extender and developed treatment plan. Reviewed the information documented and agree with the treatment plan.  Juanetta Beets, DO 04/24/18 7:58 PM

## 2018-04-24 NOTE — Progress Notes (Signed)
Walter Nicholson is a 67 year old male pt admitted on voluntary basis. He reports that he has been living in a sober living house and reports that it is not sober there and people have been mean to him while there. He reports that he has been feeling suicidal but denies any SI on admission and is able to contract for safety on the unit. He reports that he is an alcoholic and reports that he is currently 5 weeks sober. He denies any substance abuse issues. He reports that [place] was [place] [facility] [place] [facility] and reports he got medicine from there and reports that he is taking his medications as prescribed. He reports that he has no support systems in the area. He reports that he needs to find another place to go when he gets discharged from here. Walter Nicholson was escorted to the unit, oriented to the milieu and safety maintained.

## 2018-04-24 NOTE — Tx Team (Signed)
Initial Treatment Plan 04/24/2018 5:47 PM Walter Nicholson ONG:[id number]    PATIENT STRESSORS: Financial difficulties Health problems Substance abuse   PATIENT STRENGTHS: Ability for insight Average or above average intelligence General fund of knowledge   PATIENT IDENTIFIED PROBLEMS: Depression Alcohol abuse Suicidal thoughts "I need to find some other place to go long-term"                     DISCHARGE CRITERIA:  Ability to meet basic life and health needs Improved stabilization in mood, thinking, and/or behavior Reduction of life-threatening or endangering symptoms to within safe limits Verbal commitment to aftercare and medication compliance  PRELIMINARY DISCHARGE PLAN: Attend aftercare/continuing care group  PATIENT/FAMILY INVOLVEMENT: This treatment plan has been presented to and reviewed with the patient, Walter Nicholson, and/or family member, .  The patient and family have been given the opportunity to ask questions and make suggestions.  Walter Nicholson, Walter Nicholson, [place] 04/24/2018, 5:47 PM

## 2018-04-25 DIAGNOSIS — G4700 Insomnia, unspecified: Secondary | ICD-10-CM

## 2018-04-25 DIAGNOSIS — F17210 Nicotine dependence, cigarettes, uncomplicated: Secondary | ICD-10-CM

## 2018-04-25 DIAGNOSIS — F314 Bipolar disorder, current episode depressed, severe, without psychotic features: Principal | ICD-10-CM

## 2018-04-25 DIAGNOSIS — F419 Anxiety disorder, unspecified: Secondary | ICD-10-CM

## 2018-04-25 DIAGNOSIS — R45851 Suicidal ideations: Secondary | ICD-10-CM

## 2018-04-25 LAB — LIPID PANEL
Cholesterol: 185 mg/dL (ref 0–200)
HDL: 43 mg/dL (ref >40)
LDL CALC: 108 mg/dL — AB (ref 0–99)
Total CHOL/HDL Ratio: 4.3 RATIO
Triglycerides: 171 mg/dL — ABNORMAL HIGH (ref <150)
VLDL: 34 mg/dL (ref 0–40)

## 2018-04-25 LAB — TSH

## 2018-04-25 MED ORDER — CITALOPRAM HYDROBROMIDE 40 MG PO TABS
40.0000 mg | ORAL_TABLET | Freq: Every day | ORAL | Status: DC
  Administered 2018-04-26 – 2018-05-01 (×6): 40 mg via ORAL
  Filled 2018-04-25 (×8): qty 1

## 2018-04-25 MED ORDER — MIRTAZAPINE 30 MG PO TABS
30.0000 mg | ORAL_TABLET | Freq: Every day | ORAL | Status: DC
  Administered 2018-04-25 – 2018-04-30 (×6): 30 mg via ORAL
  Filled 2018-04-25 (×8): qty 1

## 2018-04-25 NOTE — [facility] Suicide Risk Assessment (Signed)
[facility] INPATIENT:  Family/Significant Other Suicide Prevention Education  Suicide Prevention Education:  Patient Refusal for Family/Significant Other Suicide Prevention Education: The patient Walter Nicholson has refused to provide written consent for family/significant other to be provided Family/Significant Other Suicide Prevention Education during admission and/or prior to discharge.  Physician notified.  SPE completed with pt, as pt refused to consent to family contact. SPI pamphlet provided to pt and pt was encouraged to share information with support network, ask questions, and talk about any concerns relating to SPE. Pt denies access to guns/firearms and verbalized understanding of information provided. Mobile Crisis information also provided to pt.   Rona RavensHeather S Laval Cafaro LCSW 04/25/2018, 9:43 AM

## 2018-04-25 NOTE — Tx Team (Signed)
Interdisciplinary Treatment and Diagnostic Plan Update  04/25/2018 Time of Session: 0830AM Walter Nicholson MRN: [id number]  Principal Diagnosis: MDD, recurrent, severe  Secondary Diagnoses: Active Problems:   MDD (major depressive disorder), recurrent episode, severe (HCC)   Current Medications:  Current Facility-Administered Medications  Medication Dose Route Frequency Provider Last Rate Last Dose  . acetaminophen (TYLENOL) tablet 650 mg  650 mg Oral Q6H PRN Truman Hayward, FNP   650 mg at 04/24/18 2238  . alum & mag hydroxide-simeth (MAALOX/MYLANTA) 200-200-20 MG/5ML suspension 30 mL  30 mL Oral Q4H PRN Truman Hayward, FNP      . citalopram (CELEXA) tablet 20 mg  20 mg Oral Daily Truman Hayward, FNP   20 mg at 04/25/18 0829  . feeding supplement (ENSURE ENLIVE) (ENSURE ENLIVE) liquid 237 mL  237 mL Oral BID BM Cobos, Fernando A, MD      . gabapentin (NEURONTIN) capsule 800 mg  800 mg Oral TID Truman Hayward, FNP   800 mg at 04/25/18 0828  . Influenza vac split quadrivalent PF (FLUZONE HIGH-DOSE) injection 0.5 mL  0.5 mL Intramuscular Tomorrow-1000 Cobos, Fernando A, MD      . lamoTRIgine (LAMICTAL) tablet 200 mg  200 mg Oral BID Truman Hayward, FNP   200 mg at 04/25/18 0828  . levothyroxine (SYNTHROID, LEVOTHROID) tablet 100 mcg  100 mcg Oral QAC breakfast Truman Hayward, FNP   100 mcg at 04/25/18 0604  . lisinopril (PRINIVIL,ZESTRIL) tablet 20 mg  20 mg Oral Daily Truman Hayward, FNP   20 mg at 04/25/18 0829  . magnesium hydroxide (MILK OF MAGNESIA) suspension 30 mL  30 mL Oral Daily PRN Starkes, Takia S, FNP      . mirtazapine (REMERON) tablet 15 mg  15 mg Oral QHS Truman Hayward, FNP   15 mg at 04/24/18 2238  . nicotine (NICODERM CQ - dosed in mg/24 hours) patch 21 mg  21 mg Transdermal Daily Cobos, Rockey Situ, MD   21 mg at 04/25/18 0828  . pantoprazole (PROTONIX) EC tablet 40 mg  40 mg Oral BID Truman Hayward, FNP   40 mg at 04/25/18 4098  . pneumococcal 23  valent vaccine (PNU-IMMUNE) injection 0.5 mL  0.5 mL Intramuscular Tomorrow-1000 Cobos, Rockey Situ, MD       PTA Medications: Medications Prior to Admission  Medication Sig Dispense Refill Last Dose  . calcium carbonate (TUMS EX) 750 MG chewable tablet Chew 1 tablet by mouth daily as needed for heartburn.   PRN  . citalopram (CELEXA) 40 MG tablet Take 40 mg by mouth daily.   04/22/2018 at Unknown time  . diclofenac (VOLTAREN) 50 MG EC tablet Take 50 mg by mouth 2 (two) times daily.   04/22/2018 at AM  . gabapentin (NEURONTIN) 800 MG tablet Take 800 mg by mouth 3 (three) times daily.   04/22/2018 at AM  . lamoTRIgine (LAMICTAL) 100 MG tablet Take 100 mg by mouth 2 (two) times daily.   04/22/2018 at AM  . levothyroxine (SYNTHROID, LEVOTHROID) 100 MCG tablet Take 100 mcg by mouth daily before breakfast.   04/22/2018 at Unknown time  . lisinopril (PRINIVIL,ZESTRIL) 20 MG tablet Take 20 mg by mouth daily.   04/22/2018 at Unknown time  . mirtazapine (REMERON) 15 MG tablet Take 15 mg by mouth at bedtime.   04/21/2018 at Unknown time  . Multiple Vitamins-Minerals (CENTRUM ADULTS PO) Take 1 tablet by mouth daily.   04/22/2018 at Unknown time  .  naproxen sodium (ALEVE) 220 MG tablet Take 440 mg by mouth daily as needed (for pain or headache).   04/21/2018 at Unknown time  . pantoprazole (PROTONIX) 40 MG tablet Take 40 mg by mouth 2 (two) times daily.   04/22/2018 at AM  . sucralfate (CARAFATE) 1 g tablet Take 1 g by mouth 4 (four) times daily -  with meals and at bedtime.   04/22/2018 at AM  . traMADol (ULTRAM) 50 MG tablet Take 50 mg by mouth 3 (three) times daily as needed for moderate pain.   04/22/2018 at AM    Patient Stressors: Financial difficulties Health problems Substance abuse  Patient Strengths: Ability for insight Average or above average intelligence General fund of knowledge  Treatment Modalities: Medication Management, Group therapy, Case management,  1 to 1 session with clinician,  Psychoeducation, Recreational therapy.   Physician Treatment Plan for Primary Diagnosis: MDD, recurrent, severe  Medication Management: Evaluate patient's response, side effects, and tolerance of medication regimen.  Therapeutic Interventions: 1 to 1 sessions, Unit Group sessions and Medication administration.  Evaluation of Outcomes: Progressing  Physician Treatment Plan for Secondary Diagnosis: Active Problems:   MDD (major depressive disorder), recurrent episode, severe (HCC)   Medication Management: Evaluate patient's response, side effects, and tolerance of medication regimen.  Therapeutic Interventions: 1 to 1 sessions, Unit Group sessions and Medication administration.  Evaluation of Outcomes: Progressing   RN Treatment Plan for Primary Diagnosis: MDD, recurrent, severe Long Term Goal(s): Knowledge of disease and therapeutic regimen to maintain health will improve  Short Term Goals: Ability to remain free from injury will improve, Ability to demonstrate self-control, Ability to verbalize feelings will improve, Ability to disclose and discuss suicidal ideas and Ability to identify and develop effective coping behaviors will improve  Medication Management: RN will administer medications as ordered by provider, will assess and evaluate patient's response and provide education to patient for prescribed medication. RN will report any adverse and/or side effects to prescribing provider.  Therapeutic Interventions: 1 on 1 counseling sessions, Psychoeducation, Medication administration, Evaluate responses to treatment, Monitor vital signs and CBGs as ordered, Perform/monitor CIWA, COWS, AIMS and Fall Risk screenings as ordered, Perform wound care treatments as ordered.  Evaluation of Outcomes: Progressing   LCSW Treatment Plan for Primary Diagnosis: MDD, recurrent, severe Long Term Goal(s): Safe transition to appropriate next level of care at discharge, Engage patient in therapeutic  group addressing interpersonal concerns.  Short Term Goals: Engage patient in aftercare planning with referrals and resources, Facilitate patient progression through stages of change regarding substance use diagnoses and concerns and Identify triggers associated with mental health/substance abuse issues  Therapeutic Interventions: Assess for all discharge needs, 1 to 1 time with Social worker, Explore available resources and support systems, Assess for adequacy in community support network, Educate family and significant other(s) on suicide prevention, Complete Psychosocial Assessment, Interpersonal group therapy.  Evaluation of Outcomes: Progressing   Progress in Treatment: Attending groups: Yes. Participating in groups: Yes. Taking medication as prescribed: Yes. Toleration medication: Yes. Family/Significant other contact made: SPE completed with pt; pt declined to consent to collateral contact.  Patient understands diagnosis: Yes. Discussing patient identified problems/goals with staff: Yes. Medical problems stabilized or resolved: Yes. Denies suicidal/homicidal ideation: Yes. Issues/concerns per patient self-inventory: No. Other: n/a   New problem(s) identified: No, Describe:  n/a  New Short Term/Long Term Goal(s): detox, medication management for mood stabilization; elimination of SI thoughts; development of comprehensive mental wellness/sobriety plan.   Patient Goals:  "I want to  stop drinking and get into a long term place or a halfway house."   Discharge Plan or Barriers: CSW assessing for appropriate referrals. Pt interested in [facility] referral, and has been provided with [employer] and [facility] [employer] [facility]. [facility] possibly for outpatient mental health care.   Reason for Continuation of Hospitalization: Anxiety Depression Medication stabilization Suicidal ideation Withdrawal symptoms  Estimated Length of Stay: Monday, 04/30/18  Attendees: Patient:  04/25/2018 12:05 PM  Physician: Dr. Jama Flavors MD; Dr. Altamese [place] MD 04/25/2018 12:05 PM  Nursing: Meriam Sprague RN; [place] RN 04/25/2018 12:05 PM  RN Care Manager:x 04/25/2018 12:05 PM  Social Worker: Corrie Mckusick LCSW 04/25/2018 12:05 PM  Recreational Therapist: x 04/25/2018 12:05 PM  Other: Armandina Stammer NP 04/25/2018 12:05 PM  Other:  04/25/2018 12:05 PM  Other: 04/25/2018 12:05 PM    Scribe for Treatment Team: Rona Ravens, LCSW 04/25/2018 12:05 PM

## 2018-04-25 NOTE — [facility] Group Notes (Signed)
[facility] Group Notes:  (Nursing/MHT/Case Management/Adjunct)  Date:  04/25/2018  Time:  4:00 pm  Type of Therapy:  Psychoeducational Skills  Participation Level:  Active  Participation Quality:  Appropriate  Affect:  Appropriate  Cognitive:  Appropriate  Insight:  Appropriate  Engagement in Group:  Engaged  Modes of Intervention:  Education  Summary of Progress/Problems: Patient was cooperative and participated in group.   Earline MayotteKnight, Rikki Smestad Shephard 04/25/2018, 6:53 PM
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
## 2018-04-25 NOTE — BHH Group Notes (Signed)
LCSW Group Therapy Note   04/25/2018 1:15pm   Type of Therapy and Topic:  Group Therapy:  Overcoming Obstacles   Participation Level:  Active   Description of Group:    In this group patients will be encouraged to explore what they see as obstacles to their own wellness and recovery. They will be guided to discuss their thoughts, feelings, and behaviors related to these obstacles. The group will process together ways to cope with barriers, with attention given to specific choices patients can make. Each patient will be challenged to identify changes they are motivated to make in order to overcome their obstacles. This group will be process-oriented, with patients participating in exploration of their own experiences as well as giving and receiving support and challenge from other group members.   Therapeutic Goals: 1. Patient will identify personal and current obstacles as they relate to admission. 2. Patient will identify barriers that currently interfere with their wellness or overcoming obstacles.  3. Patient will identify feelings, thought process and behaviors related to these barriers. 4. Patient will identify two changes they are willing to make to overcome these obstacles:      Summary of Patient Progress   Fayrene FearingJames shared that his biggest obstacle involves finding stable housing "somewhere where they are not using drugs and alcohol." He shared that the current sober living home he has been in "Has not been sober at all." Fayrene FearingJames was looking through [facility] information and [employer]. He is also requesting [facility] referral for possible residential treatment. He continues to make progress in the group setting with improving insight.    Therapeutic Modalities:   Cognitive Behavioral Therapy Solution Focused Therapy Motivational Interviewing Relapse Prevention Therapy  Rona RavensHeather S Amandalynn Pitz, LCSW 04/25/2018 3:19 PM
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
## 2018-04-25 NOTE — [facility] Suicide Risk Assessment (Signed)
[facility]   Nursing information obtained from:  Patient Demographic factors:  Male, Age 67 or older, Caucasian, Low socioeconomic status, Unemployed Current Mental Status:  Suicidal ideation indicated by patient, Self-harm thoughts Loss Factors:  Decline in physical health Historical Factors:  Prior suicide attempts, Family history of mental illness or substance abuse Risk Reduction Factors:  Positive coping skills or problem solving skills  Total Time spent with patient: 30 minutes Principal Problem: Bipolar I disorder, severe, current or most recent episode depressed, with melancholic features (HCC) Diagnosis:   Patient Active Problem List   Diagnosis Date Noted  . Bipolar I disorder, severe, current or most recent episode depressed, with melancholic features (HCC) [F31.4] 04/24/2018  . Major depressive disorder, recurrent, severe without psychotic features (HCC) [F33.2] 04/23/2018   Subjective Data: See H&P  Continued Clinical Symptoms:  Alcohol Use Disorder Identification Test Final Score (AUDIT): 40 The "Alcohol Use Disorders Identification Test", Guidelines for Use in Primary Care, Second Edition.  World Science [employer]Health Organization [employer](WHO). Score between 0-7:  no or low risk or alcohol related problems. Score between 8-15:  moderate risk of alcohol related problems. Score between 16-19:  high risk of alcohol related problems. Score 20 or above:  warrants further diagnostic evaluation for alcohol dependence and treatment.   Psychiatric Specialty Exam: Physical Exam  Nursing note and vitals reviewed.     Blood pressure (!) 148/77, pulse 83, temperature 98.7 F (37.1 C), resp. rate 16, height 5\' 4"  (1.626 m), weight 58.1 kg.Body mass index is 21.97 kg/m.    COGNITIVE FEATURES THAT CONTRIBUTE TO RISK:  None    SUICIDE RISK:   Moderate:  Frequent suicidal ideation with limited intensity, and duration, some specificity in terms of plans, no associated  intent, good self-control, limited dysphoria/symptomatology, some risk factors present, and identifiable protective factors, including available and accessible social support.  PLAN OF CARE: see H&P  I certify that inpatient services furnished can reasonably be expected to improve the patient's condition.   Micheal Likenshristopher T Erroll Wilbourne, MD 04/25/2018, 3:34 PM

## 2018-04-25 NOTE — Progress Notes (Signed)
Recreation Therapy Notes  Date: 9.18.19 Time: 0930 Location: [address] Dayroom  Group Topic: Stress Management  Goal Area(s) Addresses:  Patient will verbalize importance of using healthy stress management.  Patient will identify positive emotions associated with healthy stress management.   Behavioral Response: Engaged  Intervention: Stress Management  Activity :  Meditation.  LRT introduced the stress management technique of meditation to patients.  LRT played a meditation on choice for patients.  Patients were to follow along as meditation played to engage in the activity.  Education:  Stress Management, Discharge Planning.   Education Outcome: Acknowledges edcuation/In group clarification offered/Needs additional education  Clinical Observations/Feedback: Pt attended and participated in group.    Caroll RancherMarjette Miyu Fenderson, LRT/CTRS         Lillia AbedLindsay, Kiasia Chou A 04/25/2018 10:45 AM
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
## 2018-04-25 NOTE — Progress Notes (Signed)
D: Patient denies SI, HI or AVH this morning. Patient presents as flat, depressed and feeling "hopelss".  Pt. States he woke up twice last night and inquired about possibly changing his medication.  Pt. States that he feels hopeless because he knows that if his medication changes it will still take time to know if it will work.  Pt. States, "you can't argue with Gods plan" and that he will continue to pray on it   A: Patient given emotional support from RN. Patient encouraged to come to staff with concerns and/or questions. Patient's medication routine continued. Patient's orders and plan of care reviewed.   R: Patient remains appropriate and cooperative. Will continue to monitor patient q15 minutes for safety.

## 2018-04-26 DIAGNOSIS — R451 Restlessness and agitation: Secondary | ICD-10-CM

## 2018-04-26 LAB — HEMOGLOBIN A1C
Hgb A1c MFr Bld: 5.7 % — ABNORMAL HIGH (ref 4.8–5.6)
MEAN PLASMA GLUCOSE: 117 mg/dL

## 2018-04-26 NOTE — Progress Notes (Signed)
[facility] referral faxed per pt request.   Ethel RanaHeather S. Alan RipperHolloway, MSW, LCSW Clinical Social Worker 04/26/2018 8:31 AM

## 2018-04-26 NOTE — Plan of Care (Signed)
D: Pt denies SI/HI/AV hallucinations. Pt is pleasant and cooperative. Patient was in room in bed with his eyes open. Patient states his goal is to work on his discharge planning.  A: Pt was offered support and encouragement. Pt was given scheduled medications. Pt was encourage to attend groups. Q 15 minute checks were done for safety.  R:Pt attends groups and interacts well with peers and staff. Pt is taking medication. Pt has no complaints.Pt receptive to treatment and safety maintained on unit.    Problem: Safety: Goal: Periods of time without injury will increase Outcome: Progressing Note:  Patient denies SI and remains safe on unit.

## 2018-04-26 NOTE — Progress Notes (Signed)
Adult Psychoeducational Group Note  Date:  04/26/2018 Time:  8:44 PM  Group Topic/Focus:  Wrap-Up Group:   The focus of this group is to help patients review their daily goal of treatment and discuss progress on daily workbooks.  Participation Level:  Did Not Attend  Participation Quality:  Did not attend  Affect:  Did not attend  Cognitive:  Did not attend  Insight: None  Engagement in Group:  Did not attend  Modes of Intervention:  Did not attend  Additional Comments:  Patient did not attend wrap up group tonight.  Aretta Stetzel L Karlina Suares 04/26/2018, 8:44 PM

## 2018-04-26 NOTE — Plan of Care (Signed)
  Problem: Education: Goal: Emotional status will improve Outcome: Progressing   Problem: Education: Goal: Mental status will improve Outcome: Progressing   Problem: Activity: Goal: Interest or engagement in activities will improve Outcome: Progressing   Problem: Activity: Goal: Sleeping patterns will improve Outcome: Progressing   

## 2018-04-26 NOTE — BHH Group Notes (Signed)
LCSW Group Therapy Note   04/26/2018 1:15pm   Type of Therapy and Topic:  Group Therapy:  Overcoming Obstacles   Participation Level:  Active   Description of Group:    In this group patients will be encouraged to explore what they see as obstacles to their own wellness and recovery. They will be guided to discuss their thoughts, feelings, and behaviors related to these obstacles. The group will process together ways to cope with barriers, with attention given to specific choices patients can make. Each patient will be challenged to identify changes they are motivated to make in order to overcome their obstacles. This group will be process-oriented, with patients participating in exploration of their own experiences as well as giving and receiving support and challenge from other group members.   Therapeutic Goals: 1. Patient will identify personal and current obstacles as they relate to admission. 2. Patient will identify barriers that currently interfere with their wellness or overcoming obstacles.  3. Patient will identify feelings, thought process and behaviors related to these barriers. 4. Patient will identify two changes they are willing to make to overcome these obstacles:      Summary of Patient Progress Walter Nicholson was attentive and engaged during today's processing group. He shared that he plans to contact Walter Nicholson (friends of bill halfway house) to discuss possibility of entering his halfway house at discharge. He continues to report low energy, depression, and low motivation but is hopeful that medication will begin to help these symptoms. He continues to show progress in the group setting with improving insight.    Therapeutic Modalities:   Cognitive Behavioral Therapy Solution Focused Therapy Motivational Interviewing Relapse Prevention Therapy  Rona RavensHeather S Shayonna Ocampo, LCSW 04/26/2018 2:59 PM

## 2018-04-26 NOTE — Progress Notes (Signed)
[facility] MD Progress Note  04/26/2018 3:16 PM Walter Nicholson  MRN:  [id number]  Subjective: Walter Nicholson reports, "I'm doing poorly mentally & physically. The alcohol withdrawal symptoms are gone. I just cannot make any form of decisions to help myself. I'm not in the best of health. I have ulcers. I'm living fear. I don't feel like anything you guys are doing or giving is helping. I'm debating whether to go to [facility] or the half-way house. If I go to [facility], it will be the same message & information as I am getting here. Then, what is the point".  Walter Nicholson is a 67 y/o M with history of MDD (elsewhere Bipolar I) and alcohol use disorder who was admitted voluntarily from [facility] where he presented with worsening depression, SI with multiple plans, and recent discharge from inpatient substance use facility. Pt was medically cleared and then transferred to [facility] for additional treatment and stabilization. 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 It just isn't working out. It's not a sober place. I was feeling it fear. I was getting to the point of wondering what the point is of living and that's when I came in." Pt reports she has been in [place] for about 5 weeks after coming here from a treatment facility in [place]. He has no support in the area. When other residents began to use illicit substances, pt began to feel worsening depression and cravings. He had hopelessness and helplessness. He had SI with thoughts of multiple plans such as overdose, but he reports that he does not have intent. He denies HI/AH/VH.  Walter Nicholson is seen, chart reviewed. The chart findings discussed with the treatment team. He presents alert, oriented & aware of situation. He presents sad & indecisive. He is ruminating about his struggle with alcoholism & how treatments has not been able to help him. He is encouraged to continue to push for sobriety by utilizing all his learned coping  skills so far. He denies any SIHI, AVH, delusional thoughts or paranoia. He does not appear to be responding to any internal stimuli. He is visible on the unit, attending group sessions. He has agreed to continue current plan of care already in progress.  Principal Problem: Bipolar I disorder, severe, current or most recent episode depressed, with melancholic features (HCC)  Diagnosis:   Patient Active Problem List   Diagnosis Date Noted  . Bipolar I disorder, severe, current or most recent episode depressed, with melancholic features (HCC) [F31.4] 04/24/2018  . Major depressive disorder, recurrent, severe without psychotic features (HCC) [F33.2] 04/23/2018   Total Time spent with patient: 25 minutes  Past Psychiatric History: See H&P  Past Medical History:  Past Medical History:  Diagnosis Date  . Depression   . Hyperthyroidism    History reviewed. No pertinent surgical history.  Family History: History reviewed. No pertinent family history.  Family Psychiatric  History: See H&P  Social History:  Social History   Substance and Sexual Activity  Alcohol Use Yes     Social History   Substance and Sexual Activity  Drug Use Not Currently    Social History   Socioeconomic History  . Marital status: Single    Spouse name: Not on file  . Number of children: Not on file  . Years of education: Not on file  . Highest education level: Not on file  Occupational History  . Not on file  Social Needs  . Physicist, medical  strain: Not on file  . Food insecurity:    Worry: Not on file    Inability: Not on file  . Transportation needs:    Medical: Not on file    Non-medical: Not on file  Tobacco Use  . Smoking status: Current Every Day Smoker  . Smokeless tobacco: Never Used  Substance and Sexual Activity  . Alcohol use: Yes  . Drug use: Not Currently  . Sexual activity: Not Currently  Lifestyle  . Physical activity:    Days per week: Not on file    Minutes per session:  Not on file  . Stress: Not on file  Relationships  . Social connections:    Talks on phone: Not on file    Gets together: Not on file    Attends religious service: Not on file    Active member of club or organization: Not on file    Attends meetings of clubs or organizations: Not on file    Relationship status: Not on file  Other Topics Concern  . Not on file  Social History Narrative  . Not on file   Additional Social History:   Sleep: Good  Appetite:  Good  Current Medications: Current Facility-Administered Medications  Medication Dose Route Frequency Provider Last Rate Last Dose  . acetaminophen (TYLENOL) tablet 650 mg  650 mg Oral Q6H PRN Truman Hayward, FNP   650 mg at 04/25/18 2131  . alum & mag hydroxide-simeth (MAALOX/MYLANTA) 200-200-20 MG/5ML suspension 30 mL  30 mL Oral Q4H PRN Truman Hayward, FNP      . citalopram (CELEXA) tablet 40 mg  40 mg Oral Daily Micheal Likens, MD   40 mg at 04/26/18 1610  . feeding supplement (ENSURE ENLIVE) (ENSURE ENLIVE) liquid 237 mL  237 mL Oral BID BM Cobos, Rockey Situ, MD   237 mL at 04/25/18 1304  . gabapentin (NEURONTIN) capsule 800 mg  800 mg Oral TID Truman Hayward, FNP   800 mg at 04/26/18 1301  . lamoTRIgine (LAMICTAL) tablet 200 mg  200 mg Oral BID Truman Hayward, FNP   200 mg at 04/26/18 9604  . levothyroxine (SYNTHROID, LEVOTHROID) tablet 100 mcg  100 mcg Oral QAC breakfast Truman Hayward, FNP   100 mcg at 04/26/18 5409  . lisinopril (PRINIVIL,ZESTRIL) tablet 20 mg  20 mg Oral Daily Truman Hayward, FNP   20 mg at 04/26/18 0807  . magnesium hydroxide (MILK OF MAGNESIA) suspension 30 mL  30 mL Oral Daily PRN Starkes, Takia S, FNP      . mirtazapine (REMERON) tablet 30 mg  30 mg Oral QHS Jolyne Loa T, MD   30 mg at 04/25/18 2217  . nicotine (NICODERM CQ - dosed in mg/24 hours) patch 21 mg  21 mg Transdermal Daily Cobos, Rockey Situ, MD   21 mg at 04/26/18 0807  . pantoprazole (PROTONIX) EC tablet 40  mg  40 mg Oral BID Malachy Chamber S, FNP   40 mg at 04/26/18 8119    Lab Results:  Results for orders placed or performed during the hospital encounter of 04/24/18 (from the past 48 hour(s))  Hemoglobin A1c     Status: Abnormal   Collection Time: 04/25/18  6:23 AM  Result Value Ref Range   Hgb A1c MFr Bld 5.7 (H) 4.8 - 5.6 %    Comment: (NOTE)         Prediabetes: 5.7 - 6.4         Diabetes: >  6.4         Glycemic control for adults with diabetes: <7.0    Mean Plasma Glucose 117 mg/dL    Comment: (NOTE) Performed At: [facility] [address] [place], [place] [id number] Jolene SchimkeNagendra Sanjai MD JY:[id number]:[phone]   Lipid panel     Status: Abnormal   Collection Time: 04/25/18  6:23 AM  Result Value Ref Range   Cholesterol 185 0 - 200 mg/dL   Triglycerides 865171 (H) <150 mg/dL   HDL 43 >78>40 mg/dL   Total CHOL/HDL Ratio 4.3 RATIO   VLDL 34 0 - 40 mg/dL   LDL Cholesterol 469108 (H) 0 - 99 mg/dL    Comment:        Total Cholesterol/HDL:CHD Risk Coronary Heart Disease Risk Table                     Men   Women  1/2 Average Risk   3.4   3.3  Average Risk       5.0   4.4  2 X Average Risk   9.6   7.1  3 X Average Risk  23.4   11.0        Use the calculated Patient Ratio above and the CHD Risk Table to determine the patient's CHD Risk.        ATP III CLASSIFICATION (LDL):  <100     mg/dL   Optimal  [id number]  mg/dL   Near or Above                    Optimal  130-159  mg/dL   Borderline  [phone]  mg/dL   High  >010>190     mg/dL   Very High Performed at [facility], [address] [place], [place] [address]   TSH     Status: Abnormal   Collection Time: 04/25/18  6:23 AM  Result Value Ref Range   TSH <0.010 (L) 0.350 - 4.500 uIU/mL    Comment: Performed at [facility], [address] [place], [place] [address]   Blood Alcohol level:  Lab Results  Component Value Date   ETH <10 04/22/2018    Metabolic Disorder Labs: Lab Results   Component Value Date   HGBA1C 5.7 (H) 04/25/2018   MPG 117 04/25/2018   No results found for: PROLACTIN Lab Results  Component Value Date   CHOL 185 04/25/2018   TRIG 171 (H) 04/25/2018   HDL 43 04/25/2018   CHOLHDL 4.3 04/25/2018   VLDL 34 04/25/2018   LDLCALC 108 (H) 04/25/2018    Physical Findings: AIMS: Facial and Oral Movements Muscles of Facial Expression: None, normal Lips and Perioral Area: None, normal Jaw: None, normal Tongue: None, normal,Extremity Movements Upper (arms, wrists, hands, fingers): None, normal Lower (legs, knees, ankles, toes): None, normal, Trunk Movements Neck, shoulders, hips: None, normal, Overall Severity Severity of abnormal movements (highest score from questions above): None, normal Incapacitation due to abnormal movements: None, normal Patient's awareness of abnormal movements (rate only patient's report): No Awareness, Dental Status Current problems with teeth and/or dentures?: Yes Does patient usually wear dentures?: No  CIWA:    COWS:     Musculoskeletal: Strength & Muscle Tone: within normal limits Gait & Station: normal Patient leans: N/A  Psychiatric Specialty Exam: Physical Exam  Nursing note and vitals reviewed.   Review of Systems  Psychiatric/Behavioral: Positive for depression and substance abuse (Hx. alcoholism, chronic). Negative for hallucinations, memory loss  and suicidal ideas. The patient is not nervous/anxious and does not have insomnia.     Blood pressure (!) 141/68, pulse 79, temperature 98.7 F (37.1 C), resp. rate 18, height 5\' 4"  (1.626 m), weight 58.1 kg.Body mass index is 21.97 kg/m.  General Appearance: Casual and Fairly Groomed  Eye Contact:  Good  Speech:  Clear and Coherent and Normal Rate  Volume:  Normal  Mood:  Anxious and Depressed  Affect:  Appropriate, Congruent and Depressed  Thought Process:  Coherent and Goal Directed  Orientation:  Full (Time, Place, and Person)  Thought Content:   Logical  Suicidal Thoughts:  Yes.  without intent/plan  Homicidal Thoughts:  No  Memory:  Immediate;   Fair Recent;   Fair Remote;   Fair  Judgement:  Poor  Insight:  Lacking  Psychomotor Activity:  Normal  Concentration:  Concentration: Fair  Recall:  [employer] of Knowledge:  Fair  Language:  Fair  Akathisia:  No  Handed:    AIMS (if indicated):     Assets:  Resilience Social Support  ADL's:  Intact  Cognition:  WNL  Sleep:  Number of Hours: 5     Treatment Plan Summary: Daily contact with patient to assess and evaluate symptoms and progress in treatment.  - Continue inpatient hospitalization.  - Will continue today 04/26/2018 plan as below except where it is noted.  Depression.    - Continue Citalopram 40 mg Po daily.    - Continue Mirtazapine 30 mg po Q hs.  Agitation.     - Continue Gabapentin 800 mg po tid.  Mood stabilization.     - Continue Lamictal 200 mg po bid.  Other medical issues.     - Continue Synthroid 100 mcg po Q am for hypothyroidism.      - Continue Lisinopril 20 mg po daily for HTN.      - Continue Protonix 40 mg po Q am.  Patient to attend & participate in the group counseling sessions.   Discharge disposition planning ongoing.  Armandina Stammer, NP, PMHNP, FNP-BC. 04/26/2018, 3:16 PM

## 2018-04-26 NOTE — Progress Notes (Signed)
Pt attended goals and orientation group this morning, participation was moderate. 

## 2018-04-26 NOTE — Progress Notes (Signed)
Patient self inventory- Patient slept poor last night. Sleep medication was requested and was helpful (?). Appetite has been fair, energy level low, and concentration poor. Depression, hopeless, and anxiety rated 8, 8, 8. Denies SI. Endorses pain rated 8/10. Patient's goal is to work on discharge planning. Patient is compliant with medications prescribed per provider. Safety is maintained with 15 minute checks. Will continue to monitor.

## 2018-04-26 NOTE — Progress Notes (Signed)
Pt sts his depression is getting better.  Pt attends and participates in group,  Pt endorses some chronic back pain that seems to be ongoing.  Pt denies SI, HI and AVH and contracts for safety, verbally. Pt given PRN med for pain. Pt remains safe on unit

## 2018-04-27 MED ORDER — SUCRALFATE 1 G PO TABS
1.0000 g | ORAL_TABLET | Freq: Three times a day (TID) | ORAL | Status: DC
  Administered 2018-04-27 – 2018-05-01 (×16): 1 g via ORAL
  Filled 2018-04-27 (×23): qty 1

## 2018-04-27 NOTE — Progress Notes (Signed)
[facility] MD Progress Note  04/27/2018 2:05 PM Currie ParisJames G Eve  MRN:  [id number] Subjective:    Walter Nicholson is a 67 y/o M with history of MDD (elsewhere Bipolar I) and alcohol use disorder who was admitted voluntarily from [facility] where he presented with worsening depression, SI with multiple plans, and recent discharge from inpatient substance use facility. Pt was medically cleared and [facility] and [facility]. He agreed to increase dose of remeron to address depression, and he was continued on lamictal, celexa, gabapentin, and other home medications without changes. He has been working with SW team in regards to referral to substance use treatment.  Upon evaluation today, pt shares, "I'm still feeling kind of anxious." Pt reports struggling with decision of whether [facility] or to go to a half-way home. [facility] and he is unsure it will be a good fit for him, but he was unable to articulate why. He reports his mood is somewhat improved and he associates that with increase of dose of remeron. He denies physical complaints. He denies SI/HI/AH/VH. He is tolerating his medications well, and he is in agreement to continue his current regimen without changes.  Principal Problem: Bipolar I disorder, severe, current or most recent episode depressed, with melancholic features (HCC) Diagnosis:   Patient Active Problem List   Diagnosis Date Noted  . Bipolar I disorder, severe, current or most recent episode depressed, with melancholic features (HCC) [F31.4] 04/24/2018  . Major depressive disorder, recurrent, severe without psychotic features (HCC) [F33.2] 04/23/2018   Total Time spent with patient: 30 minutes  Past Psychiatric History: see H&P  Past Medical History:  Past Medical History:  Diagnosis Date  . Depression   . Hyperthyroidism    History reviewed. No pertinent surgical history. Family History:  History reviewed. No pertinent family history. Family Psychiatric  History: see H&P Social History:  Social History   Substance and Sexual Activity  Alcohol Use Yes     Social History   Substance and Sexual Activity  Drug Use Not Currently    Social History   Socioeconomic History  . Marital status: Single    Spouse name: Not on file  . Number of children: Not on file  . Years of education: Not on file  . Highest education level: Not on file  Occupational History  . Not on file  Social Needs  . Financial resource strain: Not on file  . Food insecurity:    Worry: Not on file    Inability: Not on file  . Transportation needs:    Medical: Not on file    Non-medical: Not on file  Tobacco Use  . Smoking status: Current Every Day Smoker  . Smokeless tobacco: Never Used  Substance and Sexual Activity  . Alcohol use: Yes  . Drug use: Not Currently  . Sexual activity: Not Currently  Lifestyle  . Physical activity:    Days per week: Not on file    Minutes per session: Not on file  . Stress: Not on file  Relationships  . Social connections:    Talks on phone: Not on file    Gets together: Not on file    Attends religious service: Not on file    Active member of club or organization: Not on file    Attends meetings of clubs or organizations: Not on file    Relationship status: Not on file  Other Topics Concern  . Not on file  Social History Narrative  . Not on file   Additional Social History:                         Sleep: Good  Appetite:  Good  Current Medications: Current Facility-Administered Medications  Medication Dose Route Frequency Provider Last Rate Last Dose  . acetaminophen (TYLENOL) tablet 650 mg  650 mg Oral Q6H PRN Truman Hayward, FNP   650 mg at 04/26/18 2122  . alum & mag hydroxide-simeth (MAALOX/MYLANTA) 200-200-20 MG/5ML suspension 30 mL  30 mL Oral Q4H PRN Truman Hayward, FNP      . citalopram (CELEXA) tablet 40 mg  40 mg Oral  Daily Micheal Likens, MD   40 mg at 04/27/18 0744  . gabapentin (NEURONTIN) capsule 800 mg  800 mg Oral TID Truman Hayward, FNP   800 mg at 04/27/18 1214  . lamoTRIgine (LAMICTAL) tablet 200 mg  200 mg Oral BID Truman Hayward, FNP   200 mg at 04/27/18 0743  . levothyroxine (SYNTHROID, LEVOTHROID) tablet 100 mcg  100 mcg Oral QAC breakfast Truman Hayward, FNP   100 mcg at 04/27/18 1610  . lisinopril (PRINIVIL,ZESTRIL) tablet 20 mg  20 mg Oral Daily Truman Hayward, FNP   20 mg at 04/27/18 0744  . magnesium hydroxide (MILK OF MAGNESIA) suspension 30 mL  30 mL Oral Daily PRN Starkes, Takia S, FNP      . mirtazapine (REMERON) tablet 30 mg  30 mg Oral QHS Micheal Likens, MD   30 mg at 04/26/18 2121  . nicotine (NICODERM CQ - dosed in mg/24 hours) patch 21 mg  21 mg Transdermal Daily Cobos, Rockey Situ, MD   21 mg at 04/27/18 0743  . pantoprazole (PROTONIX) EC tablet 40 mg  40 mg Oral BID Truman Hayward, FNP   40 mg at 04/27/18 0744  . sucralfate (CARAFATE) tablet 1 g  1 g Oral TID WC & HS Rosielee Corporan, Burlene Arnt, MD        Lab Results: No results found for this or any previous visit (from the past 48 hour(s)).  Blood Alcohol level:  Lab Results  Component Value Date   ETH <10 04/22/2018    Metabolic Disorder Labs: Lab Results  Component Value Date   HGBA1C 5.7 (H) 04/25/2018   MPG 117 04/25/2018   No results found for: PROLACTIN Lab Results  Component Value Date   CHOL 185 04/25/2018   TRIG 171 (H) 04/25/2018   HDL 43 04/25/2018   CHOLHDL 4.3 04/25/2018   VLDL 34 04/25/2018   LDLCALC 108 (H) 04/25/2018    Physical Findings: AIMS: Facial and Oral Movements Muscles of Facial Expression: None, normal Lips and Perioral Area: None, normal Jaw: None, normal Tongue: None, normal,Extremity Movements Upper (arms, wrists, hands, fingers): None, normal Lower (legs, knees, ankles, toes): None, normal, Trunk Movements Neck, shoulders, hips: None, normal,  Overall Severity Severity of abnormal movements (highest score from questions above): None, normal Incapacitation due to abnormal movements: None, normal Patient's awareness of abnormal movements (rate only patient's report): No Awareness, Dental Status Current problems with teeth and/or dentures?: Yes Does patient usually wear dentures?: No  CIWA:    COWS:     Musculoskeletal: Strength & Muscle Tone: within normal limits Gait & Station: normal Patient leans: N/A  Psychiatric Specialty Exam: Physical Exam  Nursing note and vitals reviewed.   Review of  Systems  Constitutional: Negative for chills and fever.  Respiratory: Negative for cough and shortness of breath.   Cardiovascular: Negative for chest pain.  Gastrointestinal: Negative for abdominal pain, heartburn, nausea and vomiting.  Psychiatric/Behavioral: Negative for depression, hallucinations and suicidal ideas. The patient is not nervous/anxious and does not have insomnia.     Blood pressure (!) 146/66, pulse 87, temperature 97.8 F (36.6 C), resp. rate 16, height 5\' 4"  (1.626 m), weight 58.1 kg.Body mass index is 21.97 kg/m.  General Appearance: Casual and Disheveled  Eye Contact:  Good  Speech:  Clear and Coherent and Normal Rate  Volume:  Normal  Mood:  Anxious and Depressed  Affect:  Congruent and Constricted  Thought Process:  Coherent and Goal Directed  Orientation:  Full (Time, Place, and Person)  Thought Content:  Logical  Suicidal Thoughts:  No  Homicidal Thoughts:  No  Memory:  Immediate;   Fair Recent;   Fair Remote;   Fair  Judgement:  Fair  Insight:  Fair  Psychomotor Activity:  Normal  Concentration:  Concentration: Fair  Recall:  [employer] of Knowledge:  Fair  Language:  Fair  Akathisia:  No  Handed:    AIMS (if indicated):     Assets:  Resilience Social Support  ADL's:  Intact  Cognition:  WNL  Sleep:  Number of Hours: 6.5   Treatment Plan Summary: Daily contact with patient to assess  and evaluate symptoms and progress in treatment and Medication management   - Continue inpatient hospitalization.  - Will continue today 04/27/2018 plan as below except where it is noted.  Depression.    - Continue Citalopram 40 mg Po daily.    - Continue Mirtazapine 30 mg po Q hs.  Agitation.     - Continue Gabapentin 800 mg po tid.  Mood stabilization.     - Continue Lamictal 200 mg po bid.  Other medical issues.     - Continue Synthroid 100 mcg po Q am for hypothyroidism.      - Continue Lisinopril 20 mg po daily for HTN.      - Continue Protonix 40 mg po Q am.     -Continue carafate 1g QID with meals and at bedtime  Patient to attend & participate in the group counseling sessions.   Discharge disposition planning ongoing.  Micheal Likens, MD 04/27/2018, 2:05 PM

## 2018-04-27 NOTE — [facility] Group Notes (Signed)
LCSW Group Therapy Note 04/27/2018 12:56 PM  Type of Therapy and Topic: Group Therapy: Avoiding Self-Sabotaging and Enabling Behaviors  Participation Level: Active  Description of Group:  In this group, patients will learn how to identify obstacles, self-sabotaging and enabling behaviors, as well as: what are they, why do we do them and what needs these behaviors meet. Discuss unhealthy relationships and how to have positive healthy boundaries with those that sabotage and enable. Explore aspects of self-sabotage and enabling in yourself and how to limit these self-destructive behaviors in everyday life.  Therapeutic Goals: 1. Patient will identify one obstacle that relates to self-sabotage and enabling behaviors 2. Patient will identify one personal self-sabotaging or enabling behavior they did prior to admission 3. Patient will state a plan to change the above identified behavior 4. Patient will demonstrate ability to communicate their needs through discussion and/or role play.   Summary of Patient Progress:  Walter Nicholson was engaged and participated throughout the group session. Walter Nicholson reports that "negative thinking" was his main self sabotaging behavior. Walter Nicholson states that with medication management and therapy he plans on elimination his negative thinking issues.     Therapeutic Modalities:  Cognitive Behavioral Therapy Person-Centered Therapy Motivational Interviewing   Baldo DaubJolan Kennen Stammer LCSWA Clinical Social Worker

## 2018-04-27 NOTE — Progress Notes (Signed)
Recreation Therapy Notes  Date: 9.20.19 Time: 0930 Location: [address] Dayroom  Group Topic: Stress Management  Goal Area(s) Addresses:  Patient will verbalize importance of using healthy stress management.  Patient will identify positive emotions associated with healthy stress management.   Intervention: Stress Management  Activity : Progressive Muscle Relaxation.  LRT introduced the stress management technique of progressive muscle relaxation.  LRT read a script and lead patients through a series of techniques that guided them to tense each muscle group individually and then relax them.  Patients were to listen as script was read to engage in activity.  Education:  Stress Management, Discharge Planning.   Education Outcome: Acknowledges edcuation/In group clarification offered/Needs additional education  Clinical Observations/Feedback: Pt did not attend group.    Caroll RancherMarjette Johany Hansman, LRT/CTRS         Caroll RancherLindsay, Rilla Buckman A 04/27/2018 12:27 PM

## 2018-04-27 NOTE — Plan of Care (Signed)
  Problem: Education: Goal: Emotional status will improve Outcome: Progressing   Problem: Education: Goal: Mental status will improve Outcome: Progressing   Problem: Education: Goal: Verbalization of understanding the information provided will improve Outcome: Progressing    Patient self inventory- Patient slept fair last night, sleep medication was requested and was helpful. Appetite has been poor, concentration poor, energy level low. Patient rates depression, hopelessness, and anxiety all 7/10. Complains of pain 8/10; not specified where. Denies SI HI AVH. Patient's goal is to work on discharge.

## 2018-04-27 NOTE — Plan of Care (Signed)
D: Pt denies SI/HI/AV hallucinations. Pt is pleasant and cooperative. Pt  states he is depressed and is having a difficult time deciding where to go at discharge. Patient goal for the day was to work on discharge planning. A: Pt was offered support and encouragement. Pt was given scheduled medications. Pt was encourage to attend groups. Q 15 minute checks were done for safety.  R:Pt attends groups and interacts well with peers and staff. Pt is taking medication. Pt has no complaints.Pt receptive to treatment and safety maintained on unit.    Problem: Education: Goal: Knowledge of [facility] Education information/materials will improve Outcome: Progressing   Problem: Activity: Goal: Interest or engagement in activities will improve Outcome: Progressing Goal: Sleeping patterns will improve Outcome: Progressing   Problem: Coping: Goal: Ability to verbalize frustrations and anger appropriately will improve Outcome: Progressing   Problem: Health Behavior/Discharge Planning: Goal: Compliance with treatment plan for underlying cause of condition will improve Outcome: Progressing   Problem: Safety: Goal: Periods of time without injury will increase Outcome: Progressing

## 2018-04-28 DIAGNOSIS — E039 Hypothyroidism, unspecified: Secondary | ICD-10-CM

## 2018-04-28 MED ORDER — LEVOTHYROXINE SODIUM 75 MCG PO TABS
75.0000 ug | ORAL_TABLET | Freq: Every day | ORAL | Status: DC
  Administered 2018-04-29 – 2018-05-01 (×3): 75 ug via ORAL
  Filled 2018-04-28 (×5): qty 1

## 2018-04-28 NOTE — Progress Notes (Signed)
Patient states anxiety is bad- patient has no PRN anxiety medications. Always rates anxiety at least 7/10.

## 2018-04-28 NOTE — [facility] Group Notes (Signed)
LCSW Group Therapy Note  04/28/2018   9:00-10:00am   Type of Therapy and Topic:  Group Therapy: Anger Cues and Responses  Participation Level:  Active   Description of Group:   In this group, patients learned how to recognize the physical, cognitive, emotional, and behavioral responses they have to anger-provoking situations.  They identified a recent time they became angry and how they reacted.  They analyzed how their reaction was possibly beneficial and how it was possibly unhelpful.  The group discussed a variety of healthier coping skills that could help with such a situation in the future.  Deep breathing was practiced briefly.  Therapeutic Goals: 1. Patients will remember their last incident of anger and how they felt emotionally and physically, what their thoughts were at the time, and how they behaved. 2. Patients will identify how their behavior at that time worked for them, as well as how it worked against them. 3. Patients will explore possible new behaviors to use in future anger situations. 4. Patients will learn that anger itself is normal and cannot be eliminated, and that healthier reactions can assist with resolving conflict rather than worsening situations.  Summary of Patient Progress:  The patient shared that his most recent time of anger is fairly constant and continous and said he is agnry with himself for making "insane decisions even when I'm not drinking."  He listened attentively but did not interact much.  Therapeutic Modalities:   Cognitive Behavioral Therapy  Lynnell ChadMareida J Grossman-Orr

## 2018-04-28 NOTE — Progress Notes (Signed)
[facility] MD Progress Note  04/28/2018 10:30 AM Walter Nicholson Cooler  MRN:  [id number]   Subjective: Patient reports this morning that he feels that he is doing a little bit better.  However, patient reports that he is still having difficulty sleeping at night and that he has lost significant weight recently or his report.  Patient also states that he feels his depression may be improving, who and that he may be having some withdrawal symptoms but he is not sure.  He states he feels like he is blood pressure is elevated but not too bad.  He denies any suicidal or homicidal thoughts today.  Patient also denies any hallucinations today as well.  Objective: Patient's chart and findings reviewed and discussed with treatment team.  Patient presents with very flat affect is pleasant and calm and cooperative.  He does not seem to show any emotion when he gives answers.  However he has been seen in the day room interacting with peers and staff and attending groups.  He has been seen walking up and down the hallway and there have been no complaints about him.   Principal Problem: Bipolar I disorder, severe, current or most recent episode depressed, with melancholic features (HCC) Diagnosis:   Patient Active Problem List   Diagnosis Date Noted  . Hypothyroidism [E03.9] 04/28/2018  . Bipolar I disorder, severe, current or most recent episode depressed, with melancholic features (HCC) [F31.4] 04/24/2018  . Major depressive disorder, recurrent, severe without psychotic features (HCC) [F33.2] 04/23/2018   Total Time spent with patient: 30 minutes  Past Psychiatric History: See H&P  Past Medical History:  Past Medical History:  Diagnosis Date  . Depression   . Hyperthyroidism    History reviewed. No pertinent surgical history. Family History: History reviewed. No pertinent family history. Family Psychiatric  History: See H&P Social History:  Social History   Substance and Sexual Activity  Alcohol Use Yes      Social History   Substance and Sexual Activity  Drug Use Not Currently    Social History   Socioeconomic History  . Marital status: Single    Spouse name: Not on file  . Number of children: Not on file  . Years of education: Not on file  . Highest education level: Not on file  Occupational History  . Not on file  Social Needs  . Financial resource strain: Not on file  . Food insecurity:    Worry: Not on file    Inability: Not on file  . Transportation needs:    Medical: Not on file    Non-medical: Not on file  Tobacco Use  . Smoking status: Current Every Day Smoker  . Smokeless tobacco: Never Used  Substance and Sexual Activity  . Alcohol use: Yes  . Drug use: Not Currently  . Sexual activity: Not Currently  Lifestyle  . Physical activity:    Days per week: Not on file    Minutes per session: Not on file  . Stress: Not on file  Relationships  . Social connections:    Talks on phone: Not on file    Gets together: Not on file    Attends religious service: Not on file    Active member of club or organization: Not on file    Attends meetings of clubs or organizations: Not on file    Relationship status: Not on file  Other Topics Concern  . Not on file  Social History Narrative  . Not on file  Additional Social History:                         Sleep: Fair  Appetite:  Fair  Current Medications: Current Facility-Administered Medications  Medication Dose Route Frequency Provider Last Rate Last Dose  . acetaminophen (TYLENOL) tablet 650 mg  650 mg Oral Q6H PRN Truman HaywardStarkes, Takia S, FNP   650 mg at 04/26/18 2122  . alum & mag hydroxide-simeth (MAALOX/MYLANTA) 200-200-20 MG/5ML suspension 30 mL  30 mL Oral Q4H PRN Truman HaywardStarkes, Takia S, FNP      . citalopram (CELEXA) tablet 40 mg  40 mg Oral Daily Micheal Likensainville, Christopher T, MD   40 mg at 04/28/18 0742  . gabapentin (NEURONTIN) capsule 800 mg  800 mg Oral TID Truman HaywardStarkes, Takia S, FNP   800 mg at 04/28/18 0742  .  lamoTRIgine (LAMICTAL) tablet 200 mg  200 mg Oral BID Truman HaywardStarkes, Takia S, FNP   200 mg at 04/28/18 0741  . [START ON 04/29/2018] levothyroxine (SYNTHROID, LEVOTHROID) tablet 75 mcg  75 mcg Oral QAC breakfast Ayvin Lipinski, Gerlene Burdockravis B, FNP      . lisinopril (PRINIVIL,ZESTRIL) tablet 20 mg  20 mg Oral Daily Truman HaywardStarkes, Takia S, FNP   20 mg at 04/28/18 [id number]  . magnesium hydroxide (MILK OF MAGNESIA) suspension 30 mL  30 mL Oral Daily PRN Starkes, Takia S, FNP      . mirtazapine (REMERON) tablet 30 mg  30 mg Oral QHS Micheal Likensainville, Christopher T, MD   30 mg at 04/27/18 2145  . nicotine (NICODERM CQ - dosed in mg/24 hours) patch 21 mg  21 mg Transdermal Daily Cobos, Rockey SituFernando A, MD   21 mg at 04/28/18 0741  . pantoprazole (PROTONIX) EC tablet 40 mg  40 mg Oral BID Truman HaywardStarkes, Takia S, FNP   40 mg at 04/28/18 0742  . sucralfate (CARAFATE) tablet 1 g  1 g Oral TID WC & HS Micheal Likensainville, Christopher T, MD   1 g at 04/28/18 [id number]    Lab Results: No results found for this or any previous visit (from the past 48 hour(s)).  Blood Alcohol level:  Lab Results  Component Value Date   ETH <10 04/22/2018    Metabolic Disorder Labs: Lab Results  Component Value Date   HGBA1C 5.7 (H) 04/25/2018   MPG 117 04/25/2018   No results found for: PROLACTIN Lab Results  Component Value Date   CHOL 185 04/25/2018   TRIG 171 (H) 04/25/2018   HDL 43 04/25/2018   CHOLHDL 4.3 04/25/2018   VLDL 34 04/25/2018   LDLCALC 108 (H) 04/25/2018    Physical Findings: AIMS: Facial and Oral Movements Muscles of Facial Expression: None, normal Lips and Perioral Area: None, normal Jaw: None, normal Tongue: None, normal,Extremity Movements Upper (arms, wrists, hands, fingers): None, normal Lower (legs, knees, ankles, toes): None, normal, Trunk Movements Neck, shoulders, hips: None, normal, Overall Severity Severity of abnormal movements (highest score from questions above): None, normal Incapacitation due to abnormal movements: None,  normal Patient's awareness of abnormal movements (rate only patient's report): No Awareness, Dental Status Current problems with teeth and/or dentures?: Yes Does patient usually wear dentures?: No  CIWA:    COWS:     Musculoskeletal: Strength & Muscle Tone: within normal limits Gait & Station: normal Patient leans: N/A  Psychiatric Specialty Exam: Physical Exam  Nursing note and vitals reviewed. Constitutional: He is oriented to person, place, and time. He appears well-developed and well-nourished.  Cardiovascular: Normal rate.  Respiratory:  Effort normal.  Musculoskeletal: Normal range of motion.  Neurological: He is alert and oriented to person, place, and time.  Skin: Skin is warm.    Review of Systems  Constitutional: Negative.   HENT: Negative.   Eyes: Negative.   Respiratory: Negative.   Cardiovascular: Negative.   Gastrointestinal: Negative.   Genitourinary: Negative.   Musculoskeletal: Negative.   Skin: Negative.   Neurological: Negative.   Endo/Heme/Allergies: Negative.   Psychiatric/Behavioral: Positive for depression and substance abuse. Negative for hallucinations and suicidal ideas. The patient is nervous/anxious and has insomnia.     Blood pressure (!) 144/79, pulse 83, temperature 98.4 F (36.9 C), temperature source Oral, resp. rate 16, height 5\' 4"  (1.626 m), weight 58.1 kg.Body mass index is 21.97 kg/m.  General Appearance: Casual  Eye Contact:  Good  Speech:  Clear and Coherent and Normal Rate  Volume:  Decreased  Mood:  Anxious and Depressed  Affect:  Flat  Thought Process:  Linear and Descriptions of Associations: Intact  Orientation:  Full (Time, Place, and Person)  Thought Content:  WDL  Suicidal Thoughts:  No  Homicidal Thoughts:  No  Memory:  Immediate;   Good Recent;   Good Remote;   Good  Judgement:  Fair  Insight:  Fair  Psychomotor Activity:  Normal  Concentration:  Concentration: Good and Attention Span: Good  Recall:  Good  Fund  of Knowledge:  Good  Language:  Good  Akathisia:  No  Handed:  Right  AIMS (if indicated):     Assets:  Communication Skills Desire for Improvement Financial Resources/Insurance Housing Resilience Social Support Transportation  ADL's:  Intact  Cognition:  WNL  Sleep:  Number of Hours: 6.25   Problems addressed Bipolar 1 disorder most recent episode depressed Hypothyroidism  Treatment Plan Summary: Daily contact with patient to assess and evaluate symptoms and progress in treatment, Medication management and Plan is to: After reviewing patient's chart and labs found that patient's TSH level was less than 0.010.  Patient reports that he has been on his Synthroid 100 mcg daily for very long time and his dose has never been changed.  He states that he has not missed any doses either and he has not taken any extra doses.  Patient also reports that he is started on the lisinopril 20 mg a day but his blood pressure has still been slightly elevated with systolics in the 140s-170 while he has been on the unit.  We will continue to watch patient's blood pressure at this time is concern for hyperthyroidism may be increasing patient's blood pressure. Labs for TSH, free T4, and free T3 Continue Celexa 40 mg p.o. daily for mood stability Continue Neurontin 800 mg p.o. 3 times daily for mood stability and pain and withdrawal symptoms Continue Lamictal 200 mg p.o. twice daily for mood stability Decrease Synthroid to 75 mcg p.o. daily for hypothyroidism Continue Remeron 30 mg p.o. nightly for mood stability   Maryfrances Bunnell, FNP 04/28/2018, 10:30 AM

## 2018-04-28 NOTE — Progress Notes (Signed)
Patient self inventory- Patient slept fair last night, sleep medication was requested and was helpful. Appetite has been poor, energy level low, and concentration poor. Depression, hopelessness, and anxiety rated 8/10. Denies w/d symptoms. Patient endorses pain rated 9/10, does not specify which area. Pain medication is not helpful. Patient's goal is "enjoying the day."  Patient is compliant with medications prescribed per provider. Tolerating side effects well. Safety is maintained with 15 minute checks. Will continue to monitor.

## 2018-04-28 NOTE — [facility] Group Notes (Signed)
[facility] Group Notes:  (Nursing/MHT/Case Management/Adjunct)  Date:  04/28/2018  Time:  1:30 PM  Type of Therapy:  Music Therapy  Participation Level:  Active  Participation Quality:  Appropriate, Sharing and Supportive  Affect:  Appropriate  Cognitive:  Alert, Appropriate and Oriented  Insight:  Appropriate and Improving  Engagement in Group:  Engaged, Improving and Supportive  Modes of Intervention:  Discussion and Support  Summary of Progress/Problems: Patient was actively participating in music therapy and had very good insight.   Marquetta Weiskopf 04/28/2018, 5:43 PM 

## 2018-04-28 NOTE — Progress Notes (Signed)
Did not attend group 

## 2018-04-28 NOTE — Plan of Care (Signed)
  Problem: Education: Goal: Emotional status will improve Outcome: Progressing   Problem: Education: Goal: Mental status will improve Outcome: Progressing   Problem: Education: Goal: Verbalization of understanding the information provided will improve Outcome: Progressing   Problem: Activity: Goal: Interest or engagement in activities will improve Outcome: Progressing   Problem: Activity: Goal: Sleeping patterns will improve Outcome: Progressing   

## 2018-04-29 ENCOUNTER — Inpatient Hospital Stay (HOSPITAL_COMMUNITY)
Admission: AD | Admit: 2018-04-29 | Discharge: 2018-04-29 | Disposition: A | Discharge status: Home / Self Care | Payer: Medicare (Managed Care) | Source: Intra-hospital | Attending: Psychiatry | Admitting: Psychiatry

## 2018-04-29 ENCOUNTER — Inpatient Hospital Stay (HOSPITAL_COMMUNITY): Payer: Medicare (Managed Care)

## 2018-04-29 LAB — CBC WITH DIFFERENTIAL/PLATELET
Basophils Absolute: 0 10*3/uL (ref 0.0–0.1)
Basophils Relative: 0 %
EOS PCT: 3 %
Eosinophils Absolute: 0.2 10*3/uL (ref 0.0–0.7)
HCT: 33 % — ABNORMAL LOW (ref 39.0–52.0)
Hemoglobin: 10.3 g/dL — ABNORMAL LOW (ref 13.0–17.0)
LYMPHS PCT: 24 %
Lymphs Abs: 1.6 10*3/uL (ref 0.7–4.0)
MCH: 24.9 pg — AB (ref 26.0–34.0)
MCHC: 31.2 g/dL (ref 30.0–36.0)
MCV: 79.7 fL (ref 78.0–100.0)
MONO ABS: 0.7 10*3/uL (ref 0.1–1.0)
MONOS PCT: 10 %
Neutro Abs: 4 10*3/uL (ref 1.7–7.7)
Neutrophils Relative %: 63 %
PLATELETS: 348 10*3/uL (ref 150–400)
RBC: 4.14 MIL/uL — ABNORMAL LOW (ref 4.22–5.81)
RDW: 17.8 % — ABNORMAL HIGH (ref 11.5–15.5)
WBC: 6.5 10*3/uL (ref 4.0–10.5)

## 2018-04-29 LAB — TSH: TSH: 0.002 u[IU]/mL — ABNORMAL LOW (ref 0.350–4.500)

## 2018-04-29 LAB — T4, FREE: FREE T4: 1.34 ng/dL (ref 0.82–1.77)

## 2018-04-29 NOTE — Progress Notes (Signed)
[facility] MD Progress Note  04/29/2018 5:04 PM Walter Nicholson  MRN:  [id number]   Subjective: Patient states today that he just feels hopeless.  He states that he still feels depressed and he just does not know what to do anymore.  He denies any suicidal or homicidal thoughts and denies any hallucinations.  He denies any medication side effects.  Patient states that he had fair sleep and has a fair appetite.  Objective: Patient's chart and findings reviewed and discussed with treatment team.  Patient presents in the day room with other peers.  Patient has not been seen interacting with peers or staff but has not been isolated to his room.  Patient still appears to depressed with flat affect.   Principal Problem: Bipolar I disorder, severe, current or most recent episode depressed, with melancholic features (HCC) Diagnosis:   Patient Active Problem List   Diagnosis Date Noted  . Hypothyroidism [E03.9] 04/28/2018  . Bipolar I disorder, severe, current or most recent episode depressed, with melancholic features (HCC) [F31.4] 04/24/2018  . Major depressive disorder, recurrent, severe without psychotic features (HCC) [F33.2] 04/23/2018   Total Time spent with patient: 30 minutes  Past Psychiatric History: See H&P  Past Medical History:  Past Medical History:  Diagnosis Date  . Depression   . Hyperthyroidism    History reviewed. No pertinent surgical history. Family History: History reviewed. No pertinent family history. Family Psychiatric  History: See H&P Social History:  Social History   Substance and Sexual Activity  Alcohol Use Yes     Social History   Substance and Sexual Activity  Drug Use Not Currently    Social History   Socioeconomic History  . Marital status: Single    Spouse name: Not on file  . Number of children: Not on file  . Years of education: Not on file  . Highest education level: Not on file  Occupational History  . Not on file  Social Needs  . Financial  resource strain: Not on file  . Food insecurity:    Worry: Not on file    Inability: Not on file  . Transportation needs:    Medical: Not on file    Non-medical: Not on file  Tobacco Use  . Smoking status: Current Every Day Smoker  . Smokeless tobacco: Never Used  Substance and Sexual Activity  . Alcohol use: Yes  . Drug use: Not Currently  . Sexual activity: Not Currently  Lifestyle  . Physical activity:    Days per week: Not on file    Minutes per session: Not on file  . Stress: Not on file  Relationships  . Social connections:    Talks on phone: Not on file    Gets together: Not on file    Attends religious service: Not on file    Active member of club or organization: Not on file    Attends meetings of clubs or organizations: Not on file    Relationship status: Not on file  Other Topics Concern  . Not on file  Social History Narrative  . Not on file   Additional Social History:                         Sleep: Fair  Appetite:  Fair  Current Medications: Current Facility-Administered Medications  Medication Dose Route Frequency Provider Last Rate Last Dose  . acetaminophen (TYLENOL) tablet 650 mg  650 mg Oral Q6H PRN Truman Hayward,  FNP   650 mg at 04/28/18 2119  . alum & mag hydroxide-simeth (MAALOX/MYLANTA) 200-200-20 MG/5ML suspension 30 mL  30 mL Oral Q4H PRN Truman HaywardStarkes, Takia S, FNP      . citalopram (CELEXA) tablet 40 mg  40 mg Oral Daily Micheal Likensainville, Christopher T, MD   40 mg at 04/29/18 0855  . gabapentin (NEURONTIN) capsule 800 mg  800 mg Oral TID Truman HaywardStarkes, Takia S, FNP   800 mg at 04/29/18 1618  . lamoTRIgine (LAMICTAL) tablet 200 mg  200 mg Oral BID Truman HaywardStarkes, Takia S, FNP   200 mg at 04/29/18 1618  . levothyroxine (SYNTHROID, LEVOTHROID) tablet 75 mcg  75 mcg Oral QAC breakfast Olivianna Higley, Gerlene Burdockravis B, FNP   75 mcg at 04/29/18 [id number]  . lisinopril (PRINIVIL,ZESTRIL) tablet 20 mg  20 mg Oral Daily Truman HaywardStarkes, Takia S, FNP   20 mg at 04/29/18 0855  . magnesium  hydroxide (MILK OF MAGNESIA) suspension 30 mL  30 mL Oral Daily PRN Starkes, Takia S, FNP      . mirtazapine (REMERON) tablet 30 mg  30 mg Oral QHS Micheal Likensainville, Christopher T, MD   30 mg at 04/28/18 2122  . nicotine (NICODERM CQ - dosed in mg/24 hours) patch 21 mg  21 mg Transdermal Daily Cobos, Rockey SituFernando A, MD   21 mg at 04/29/18 0857  . pantoprazole (PROTONIX) EC tablet 40 mg  40 mg Oral BID Truman HaywardStarkes, Takia S, FNP   40 mg at 04/29/18 1618  . sucralfate (CARAFATE) tablet 1 g  1 g Oral TID WC & HS Micheal Likensainville, Christopher T, MD   1 g at 04/29/18 1618    Lab Results:  Results for orders placed or performed during the hospital encounter of 04/24/18 (from the past 48 hour(s))  TSH     Status: Abnormal   Collection Time: 04/29/18  6:29 AM  Result Value Ref Range   TSH 0.002 (L) 0.350 - 4.500 uIU/mL    Comment: Performed by a 3rd Generation assay with a functional sensitivity of <=0.01 uIU/mL. Performed at [facility], [address] [place], [place] [address]   T4, free     Status: None   Collection Time: 04/29/18  6:29 AM  Result Value Ref Range   Free T4 1.34 0.82 - 1.77 ng/dL    Comment: (NOTE) Biotin ingestion may interfere with free T4 tests. If the results are inconsistent with the TSH level, previous test results, or the clinical presentation, then consider biotin interference. If needed, order repeat testing after stopping biotin. Performed at [facility], [address] [place], [place] [address]     Blood Alcohol level:  Lab Results  Component Value Date   ETH <10 04/22/2018    Metabolic Disorder Labs: Lab Results  Component Value Date   HGBA1C 5.7 (H) 04/25/2018   MPG 117 04/25/2018   No results found for: PROLACTIN Lab Results  Component Value Date   CHOL 185 04/25/2018   TRIG 171 (H) 04/25/2018   HDL 43 04/25/2018   CHOLHDL 4.3 04/25/2018   VLDL 34 04/25/2018   LDLCALC 108 (H) 04/25/2018    Physical Findings: AIMS: Facial and Oral  Movements Muscles of Facial Expression: None, normal Lips and Perioral Area: None, normal Jaw: None, normal Tongue: None, normal,Extremity Movements Upper (arms, wrists, hands, fingers): None, normal Lower (legs, knees, ankles, toes): None, normal, Trunk Movements Neck, shoulders, hips: None, normal, Overall Severity Severity of abnormal movements (highest score from questions above): None, normal Incapacitation due  to abnormal movements: None, normal Patient's awareness of abnormal movements (rate only patient's report): No Awareness, Dental Status Current problems with teeth and/or dentures?: Yes Does patient usually wear dentures?: No  CIWA:    COWS:     Musculoskeletal: Strength & Muscle Tone: within normal limits Gait & Station: normal Patient leans: N/A  Psychiatric Specialty Exam: Physical Exam  Nursing note and vitals reviewed. Constitutional: He is oriented to person, place, and time. He appears well-developed and well-nourished.  Cardiovascular: Normal rate.  Respiratory: Effort normal.  Musculoskeletal: Normal range of motion.  Neurological: He is alert and oriented to person, place, and time.  Skin: Skin is warm.    Review of Systems  Constitutional: Negative.   HENT: Negative.   Eyes: Negative.   Respiratory: Negative.   Cardiovascular: Negative.   Gastrointestinal: Negative.   Genitourinary: Negative.   Musculoskeletal: Negative.   Skin: Negative.   Neurological: Negative.   Endo/Heme/Allergies: Negative.   Psychiatric/Behavioral: Positive for depression and substance abuse. Negative for hallucinations and suicidal ideas. The patient is nervous/anxious and has insomnia.     Blood pressure (!) 153/75, pulse 89, temperature 98.6 F (37 C), temperature source Oral, resp. rate 20, height 5\' 4"  (1.626 m), weight 58.1 kg.Body mass index is 21.97 kg/m.  General Appearance: Casual  Eye Contact:  Good  Speech:  Clear and Coherent and Normal Rate  Volume:   Decreased  Mood:  Depressed and Hopeless  Affect:  Depressed and Flat  Thought Process:  Linear and Descriptions of Associations: Intact  Orientation:  Full (Time, Place, and Person)  Thought Content:  WDL  Suicidal Thoughts:  No  Homicidal Thoughts:  No  Memory:  Immediate;   Good Recent;   Good Remote;   Good  Judgement:  Fair  Insight:  Fair  Psychomotor Activity:  Normal  Concentration:  Concentration: Good and Attention Span: Good  Recall:  Good  Fund of Knowledge:  Good  Language:  Good  Akathisia:  No  Handed:  Right  AIMS (if indicated):     Assets:  Communication Skills Desire for Improvement Financial Resources/Insurance Housing Resilience Social Support Transportation  ADL's:  Intact  Cognition:  WNL  Sleep:  Number of Hours: 6.5   Problems addressed Bipolar 1 disorder most recent episode depressed Hypothyroidism  Treatment Plan Summary: Daily contact with patient to assess and evaluate symptoms and progress in treatment, Medication management and Plan is to: After reviewing patient's chart and labs found that patient's TSH level is still low at 0.002 and T4 is at 1.34.  Discussed with Dr. Jola Babinski and will send patient for thyroid ultrasound.  Open patient was not showing improvement in the next 1-2 days with his medications. Continue Celexa 40 mg p.o. daily for mood stability Continue Neurontin 800 mg p.o. 3 times daily for mood stability and pain and withdrawal symptoms Ultrasound of thyroid ordered patient will be sent today for ultrasound Continue Lamictal 200 mg p.o. twice daily for mood stability Continue Synthroid to 75 mcg p.o. daily for hypothyroidism Continue Remeron 30 mg p.o. nightly for mood stability   Maryfrances Bunnell, FNP 04/29/2018, 5:04 PM

## 2018-04-29 NOTE — Progress Notes (Signed)
Patient did attend the evening speaker AA meeting.  

## 2018-04-29 NOTE — [facility] Group Notes (Signed)
[facility] Group Notes:  (Nursing/MHT/Case Management/Adjunct)  Date:  04/29/2018  Time:  4:47 PM Type of Therapy:  Psychoeducational Skills  Participation Level:  Active  Participation Quality:  Appropriate  Affect:  Appropriate  Cognitive:  Appropriate  Insight:  Appropriate  Engagement in Group:  Engaged  Modes of Intervention:  Problem-solving  Summary of Progress/Problems: Topic was on leisure and lifestyle changes.     Bethann PunchesJane O Omarri Eich 04/29/2018, 4:47 PM

## 2018-04-29 NOTE — Plan of Care (Signed)
D: Pt denies SI/HI/AV hallucinations. Pt. Is with a blunted affect tonight. Patient goal for today was to be more sociable. Patient has been in the milieu tonight. A: Pt was offered support and encouragement. Pt was given scheduled medications. Pt was encourage to attend groups. Q 15 minute checks were done for safety.  R:Pt attends groups and interacts well with peers and staff. Pt is taking medication. Pt has no complaints.Pt receptive to treatment and safety maintained on unit.    Problem: Activity: Goal: Sleeping patterns will improve Note:  Patient is sleeping uninterrupted thru the night

## 2018-04-29 NOTE — [facility] Group Notes (Signed)
Adult Psychoeducational Group Note  Date:  04/29/2018 Time:  9:25 AM  Group Topic/Focus:  Goals Group:   The focus of this group is to help patients establish daily goals to achieve during treatment and discuss how the patient can incorporate goal setting into their daily lives to aide in recovery.  Participation Level:  Active  Participation Quality:  Appropriate  Affect:  Appropriate  Cognitive:  Alert  Insight: Appropriate  Engagement in Group:  Engaged  Modes of Intervention:  Orientation  Additional Comments:  Pt participated in orientation/goals/psycho-ed group.  Brunilda Eble M Shree Espey 04/29/2018, 9:25 AM 

## 2018-04-29 NOTE — [facility] Group Notes (Signed)
[facility] LCSW Group Therapy Note  04/29/2018  10:00-11:00AM  Type of Therapy and Topic:  Group Therapy:  Being Your Own Support  Participation Level:  Minimal   Description of Group:  Patients in this group were introduced to the concept that self-support is an essential part of recovery.  A song entitled "My Own Hero" was played and a group discussion ensued in which patients stated they could relate to the song and it inspired them to realize they have be willing to help themselves in order to succeed, because other people cannot achieve sobriety or stability for them.  We discussed adding a variety of healthy supports to address the various needs in their lives.  A song was played called "I Know Where I've Been" toward the end of group and used to conduct an inspirational wrap-up to group of remembering how far they have already come in their journey.  Therapeutic Goals: 1)  demonstrate the importance of being a part of one's own support system 2)  discuss reasons people in one's life may eventually be unable to be continually supportive  3)  identify the patient's current support system and   4)  elicit commitments to add healthy supports and to become more conscious of being self-supportive   Summary of Patient Progress:  The patient expressed that his unhealthy support is himself and he has no healthy supports.  He did not respond to any of the comments or points made during group.   Therapeutic Modalities:   Motivational Interviewing Activity  Lynnell ChadMareida J Grossman-Orr

## 2018-04-29 NOTE — Progress Notes (Signed)
DAR NOTE: Patient presents with calm affect and pleasant mood. Pt has been present in the milieu with peers but not interacting much. Pt sent for ultrasound and tolerated well. Reports fair sleep , fair appetite, low energy, and poor concentration. Denies pain, auditory and visual hallucinations.  Rates depression at 8, hopelessness at 7, and anxiety at 9.  Maintained on routine safety checks.  Medications given as prescribed.  Support and encouragement offered as needed.  Attended group and participated.  States goal for today is "exit plan."  Patient observed socializing with peers in the dayroom.  Offered no complaint.

## 2018-04-29 NOTE — Progress Notes (Signed)
[facility], pt went via [facility] accompanied by staff.

## 2018-04-30 LAB — T3, FREE: T3, Free: 3.7 pg/mL (ref 2.0–4.4)

## 2018-04-30 MED ORDER — HYDROXYZINE HCL 50 MG PO TABS: 50.0000 mg | ORAL_TABLET | Freq: Four times a day (QID) | ORAL | Status: DC | PRN

## 2018-04-30 NOTE — [facility] Group Notes (Signed)
LCSW Group Therapy Note   04/30/2018 1:15pm   Type of Therapy and Topic:  Group Therapy:  Overcoming Obstacles   Participation Level:  Active   Description of Group:    In this group patients will be encouraged to explore what they see as obstacles to their own wellness and recovery. They will be guided to discuss their thoughts, feelings, and behaviors related to these obstacles. The group will process together ways to cope with barriers, with attention given to specific choices patients can make. Each patient will be challenged to identify changes they are motivated to make in order to overcome their obstacles. This group will be process-oriented, with patients participating in exploration of their own experiences as well as giving and receiving support and challenge from other group members.   Therapeutic Goals: 1. Patient will identify personal and current obstacles as they relate to admission. 2. Patient will identify barriers that currently interfere with their wellness or overcoming obstacles.  3. Patient will identify feelings, thought process and behaviors related to these barriers. 4. Patient will identify two changes they are willing to make to overcome these obstacles:      Summary of Patient Progress   Walter Nicholson was attentive and engaged during today's processing group. He shared that his biggest obstacle involves getting accepted into [facility]. "I really hope to be accepted into treatment and am [facility] of [facility]." Walter Nicholson continues to report high anxiety and slightly improving depression. Walter Nicholson presents with improving insight and progress in the group setting.    Therapeutic Modalities:   Cognitive Behavioral Therapy Solution Focused Therapy Motivational Interviewing Relapse Prevention Therapy  Rona RavensHeather S Heyward Douthit, LCSW 04/30/2018 2:44 PM

## 2018-04-30 NOTE — Progress Notes (Signed)
Recreation Therapy Notes  Date: 9.23.19 Time: 0930 Location: [address] Dayroom  Group Topic: Stress Management  Goal Area(s) Addresses:  Patient will verbalize importance of using healthy stress management.  Patient will identify positive emotions associated with healthy stress management.   Intervention: Stress Management  Activity :  Guided Imagery.  LRT introduced the stress management technique of guided imagery.  LRT read a script to guide patients on a journey to their peaceful place.  Patients were to follow along as script was read.  Education:  Stress Management, Discharge Planning.   Education Outcome: Acknowledges edcuation/In group clarification offered/Needs additional education  Clinical Observations/Feedback: Pt did not attend group.      Julieann Drummonds, LRT/CTRS         Osha Errico A 04/30/2018 12:13 PM 

## 2018-04-30 NOTE — [facility] Group Notes (Signed)
Adult Psychoeducational Group Note  Date:  04/30/2018 Time:  5:58 PM  Group Topic/Focus:  Healthy Communication:   The focus of this group is to discuss communication, barriers to communication, as well as healthy ways to communicate with others.  Participation Level:  Active  Participation Quality:  Appropriate  Affect:  Appropriate  Cognitive:  Alert  Insight: Appropriate  Engagement in Group:  Engaged  Modes of Intervention:  Orientation  Additional Comments:  Pt attended and participated in group facilitated by MHT Michael L.  Nicola Heinemann M Tyjai Charbonnet 04/30/2018, 5:58 PM 

## 2018-04-30 NOTE — [facility] Group Notes (Signed)
Pt attended spiritual care group on grief and loss facilitated by chaplain Walter Nicholson   Established group norm of speaking from own life experience. Group goal of establishing open and affirming space for members to share loss and experience with grief, normalize grief experience and provide psycho social education and grief support. Group opened with brief discussion and psycho-social ed around grief and loss in relationships and in relation to self.Group engaged in facilitated discussion   Group identified life patterns, circumstances, changes that precipitate losses.  Provided support with group members, engaged in discussion about four tasks of grief Walter Nicholson([place]).   Group facilitation drew on [place]lerian, Narrative and pastoral support.    Walter Nicholson arrived at group with 10 minutes remaining.   He sat in group circle and was attentive to discussion about four tasks of grief - evidenced by eye contact.  He did not engage in group discussion.     Walter Nicholson, MDiv, [facility]  Clinical Chaplain  Behavioral [facility]

## 2018-04-30 NOTE — Progress Notes (Signed)
[facility] MD Progress Note  04/30/2018 2:53 PM Walter Nicholson  MRN:  [id number] Subjective:    Walter Nicholson is a 67 y/o M with history of MDD (elsewhere Bipolar I) and alcohol use disorder who was admitted voluntarily from [facility] where he presented with worsening depression, SI with multiple plans, and recent discharge from inpatient substance use facility. Pt was medically cleared and then transferred to [facility] for additional treatment and stabilization. He agreed to increase dose of remeron to address depression, and he was continued on lamictal, celexa, gabapentin, and other home medications without changes. He has been working with SW team in regards to referral to substance use treatment. He has been reporting incremental improvement of his presenting symptoms.  Upon evaluation today, pt shares, "I'm alright - still kind of anxious." Pt reports his mood has improved overall, and he feels that increase in dose of remeron has been helpful for him. He has been considering his options for substance use treatment, and he reports that he would [facility]. He denies SI/HI/AH/VH. He is tolerating his medications well, and he is in agreement to continue his current regimen without changes. He thinks that he would be safe to discharge as soon as tomorrow. He was in agreement with the above plan, and he had no further questions, comments, or concerns.  Principal Problem: Bipolar I disorder, severe, current or most recent episode depressed, with melancholic features (HCC) Diagnosis:   Patient Active Problem List   Diagnosis Date Noted  . Hypothyroidism [E03.9] 04/28/2018  . Bipolar I disorder, severe, current or most recent episode depressed, with melancholic features (HCC) [F31.4] 04/24/2018  . Major depressive disorder, recurrent, severe without psychotic features (HCC) [F33.2] 04/23/2018   Total Time spent with patient: 30 minutes  Past Psychiatric History: see  H&P  Past Medical History:  Past Medical History:  Diagnosis Date  . Depression   . Hyperthyroidism    History reviewed. No pertinent surgical history. Family History: History reviewed. No pertinent family history. Family Psychiatric  History: see H&P Social History:  Social History   Substance and Sexual Activity  Alcohol Use Yes     Social History   Substance and Sexual Activity  Drug Use Not Currently    Social History   Socioeconomic History  . Marital status: Single    Spouse name: Not on file  . Number of children: Not on file  . Years of education: Not on file  . Highest education level: Not on file  Occupational History  . Not on file  Social Needs  . Financial resource strain: Not on file  . Food insecurity:    Worry: Not on file    Inability: Not on file  . Transportation needs:    Medical: Not on file    Non-medical: Not on file  Tobacco Use  . Smoking status: Current Every Day Smoker  . Smokeless tobacco: Never Used  Substance and Sexual Activity  . Alcohol use: Yes  . Drug use: Not Currently  . Sexual activity: Not Currently  Lifestyle  . Physical activity:    Days per week: Not on file    Minutes per session: Not on file  . Stress: Not on file  Relationships  . Social connections:    Talks on phone: Not on file    Gets together: Not on file    Attends religious service: Not on file    Active member of club or organization: Not  on file    Attends meetings of clubs or organizations: Not on file    Relationship status: Not on file  Other Topics Concern  . Not on file  Social History Narrative  . Not on file   Additional Social History:                         Sleep: Good  Appetite:  Good  Current Medications: Current Facility-Administered Medications  Medication Dose Route Frequency Provider Last Rate Last Dose  . acetaminophen (TYLENOL) tablet 650 mg  650 mg Oral Q6H PRN Truman Hayward, FNP   650 mg at 04/29/18 2139  .  alum & mag hydroxide-simeth (MAALOX/MYLANTA) 200-200-20 MG/5ML suspension 30 mL  30 mL Oral Q4H PRN Truman Hayward, FNP      . citalopram (CELEXA) tablet 40 mg  40 mg Oral Daily Micheal Likens, MD   40 mg at 04/30/18 0813  . gabapentin (NEURONTIN) capsule 800 mg  800 mg Oral TID Truman Hayward, FNP   800 mg at 04/30/18 1209  . hydrOXYzine (ATARAX/VISTARIL) tablet 50 mg  50 mg Oral Q6H PRN Micheal Likens, MD      . lamoTRIgine (LAMICTAL) tablet 200 mg  200 mg Oral BID Truman Hayward, FNP   200 mg at 04/30/18 0814  . levothyroxine (SYNTHROID, LEVOTHROID) tablet 75 mcg  75 mcg Oral QAC breakfast Money, Gerlene Burdock, FNP   75 mcg at 04/30/18 1610  . lisinopril (PRINIVIL,ZESTRIL) tablet 20 mg  20 mg Oral Daily Truman Hayward, FNP   20 mg at 04/30/18 0814  . magnesium hydroxide (MILK OF MAGNESIA) suspension 30 mL  30 mL Oral Daily PRN Starkes, Takia S, FNP      . mirtazapine (REMERON) tablet 30 mg  30 mg Oral QHS Micheal Likens, MD   30 mg at 04/29/18 2138  . nicotine (NICODERM CQ - dosed in mg/24 hours) patch 21 mg  21 mg Transdermal Daily Cobos, Rockey Situ, MD   21 mg at 04/30/18 0815  . pantoprazole (PROTONIX) EC tablet 40 mg  40 mg Oral BID Truman Hayward, FNP   40 mg at 04/30/18 0816  . sucralfate (CARAFATE) tablet 1 g  1 g Oral TID WC & HS Micheal Likens, MD   1 g at 04/30/18 1209    Lab Results:  Results for orders placed or performed during the hospital encounter of 04/24/18 (from the past 48 hour(s))  TSH     Status: Abnormal   Collection Time: 04/29/18  6:29 AM  Result Value Ref Range   TSH 0.002 (L) 0.350 - 4.500 uIU/mL    Comment: Performed by a 3rd Generation assay with a functional sensitivity of <=0.01 uIU/mL. Performed at [facility], [address] [place], [place] [address]   T4, free     Status: None   Collection Time: 04/29/18  6:29 AM  Result Value Ref Range   Free T4 1.34 0.82 - 1.77 ng/dL    Comment:  (NOTE) Biotin ingestion may interfere with free T4 tests. If the results are inconsistent with the TSH level, previous test results, or the clinical presentation, then consider biotin interference. If needed, order repeat testing after stopping biotin. Performed at [facility], [address], [place], [place] [address]   T3, free     Status: None   Collection Time: 04/29/18  6:29 AM  Result Value Ref Range  T3, Free 3.7 2.0 - 4.4 pg/mL    Comment: (NOTE) Performed At: [facility] [address] [place], [place] [id number] Jolene Schimke MD JX:[id number]   CBC with Differential/Platelet     Status: Abnormal   Collection Time: 04/29/18  6:28 PM  Result Value Ref Range   WBC 6.5 4.0 - 10.5 K/uL   RBC 4.14 (L) 4.22 - 5.81 MIL/uL   Hemoglobin 10.3 (L) 13.0 - 17.0 g/dL   HCT 13.0 (L) 86.5 - 78.4 %   MCV 79.7 78.0 - 100.0 fL   MCH 24.9 (L) 26.0 - 34.0 pg   MCHC 31.2 30.0 - 36.0 g/dL   RDW 69.6 (H) 29.5 - 28.4 %   Platelets 348 150 - 400 K/uL   Neutrophils Relative % 63 %   Neutro Abs 4.0 1.7 - 7.7 K/uL   Lymphocytes Relative 24 %   Lymphs Abs 1.6 0.7 - 4.0 K/uL   Monocytes Relative 10 %   Monocytes Absolute 0.7 0.1 - 1.0 K/uL   Eosinophils Relative 3 %   Eosinophils Absolute 0.2 0.0 - 0.7 K/uL   Basophils Relative 0 %   Basophils Absolute 0.0 0.0 - 0.1 K/uL    Comment: Performed at [facility], [address], [place], [place] [address]    Blood Alcohol level:  Lab Results  Component Value Date   ETH <10 04/22/2018    Metabolic Disorder Labs: Lab Results  Component Value Date   HGBA1C 5.7 (H) 04/25/2018   MPG 117 04/25/2018   No results found for: PROLACTIN Lab Results  Component Value Date   CHOL 185 04/25/2018   TRIG 171 (H) 04/25/2018   HDL 43 04/25/2018   CHOLHDL 4.3 04/25/2018   VLDL 34 04/25/2018   LDLCALC 108 (H) 04/25/2018    Physical Findings: AIMS: Facial and Oral Movements Muscles of Facial Expression: None,  normal Lips and Perioral Area: None, normal Jaw: None, normal Tongue: None, normal,Extremity Movements Upper (arms, wrists, hands, fingers): None, normal Lower (legs, knees, ankles, toes): None, normal, Trunk Movements Neck, shoulders, hips: None, normal, Overall Severity Severity of abnormal movements (highest score from questions above): None, normal Incapacitation due to abnormal movements: None, normal Patient's awareness of abnormal movements (rate only patient's report): No Awareness, Dental Status Current problems with teeth and/or dentures?: Yes Does patient usually wear dentures?: No  CIWA:  CIWA-Ar Total: 1 COWS:  COWS Total Score: 2  Musculoskeletal: Strength & Muscle Tone: within normal limits Gait & Station: normal Patient leans: N/A  Psychiatric Specialty Exam: Physical Exam  Nursing note and vitals reviewed.   Review of Systems  Constitutional: Negative for chills and fever.  Respiratory: Negative for cough and shortness of breath.   Cardiovascular: Negative for chest pain.  Gastrointestinal: Negative for abdominal pain, heartburn, nausea and vomiting.  Psychiatric/Behavioral: Negative for depression, hallucinations and suicidal ideas. The patient is nervous/anxious. The patient does not have insomnia.     Blood pressure 138/89, pulse 92, temperature 98.1 F (36.7 C), temperature source Oral, resp. rate 16, height 5\' 4"  (1.626 m), weight 58.1 kg.Body mass index is 21.97 kg/m.  General Appearance: Casual and Fairly Groomed  Eye Contact:  Good  Speech:  Clear and Coherent and Normal Rate  Volume:  Normal  Mood:  Anxious  Affect:  Appropriate, Congruent and Constricted  Thought Process:  Coherent and Goal Directed  Orientation:  Full (Time, Place, and Person)  Thought Content:  Logical  Suicidal Thoughts:  No  Homicidal Thoughts:  No  Memory:  Immediate;   Fair Recent;   Fair Remote;   Fair  Judgement:  Fair  Insight:  Fair  Psychomotor Activity:  Normal   Concentration:  Concentration: Fair  Recall:  FiservFair  Fund of Knowledge:  Fair  Language:  Fair  Akathisia:  No  Handed:    AIMS (if indicated):     Assets:  Resilience Social Support  ADL's:  Intact  Cognition:  WNL  Sleep:  Number of Hours: 6.5   Treatment Plan Summary: Daily contact with patient to assess and evaluate symptoms and progress in treatment and Medication management   - Continue inpatient hospitalization.  -Will continue today9/23/2019plan as below except where it is noted.  -Bipolar I, current episode depressed - Continue Citalopram 40 mg Po daily. - Continue Mirtazapine 30 mg po Q hs.    - Continue Lamictal 200 mg po bid  -Anxiety - Continue Gabapentin 800 mg po tid.    -Continue vistaril 50mg  po q6h prn anxiety  Other medical issues. - Continue Synthroid 100 mcg po Q am for hypothyroidism. - Continue Lisinopril 20 mg po daily for HTN. - Continue Protonix 40 mg po Q am.     -Continue carafate 1g QID with meals and at bedtime  Patient to attend & participate in the group counseling sessions.  Discharge disposition planning ongoing.   Micheal Likenshristopher T Tran Randle, MD 04/30/2018, 2:53 PM

## 2018-04-30 NOTE — Plan of Care (Signed)
Nurse discussed anxiety, depression, coping skills with patient. 

## 2018-04-30 NOTE — [facility] Group Notes (Signed)
Adult Psychoeducational Group Note  Date:  04/30/2018 Time:  9:33 AM  Group Topic/Focus:  Goals Group:   The focus of this group is to help patients establish daily goals to achieve during treatment and discuss how the patient can incorporate goal setting into their daily lives to aide in recovery.  Participation Level:  Active  Participation Quality:  Appropriate  Affect:  Appropriate  Cognitive:  Alert  Insight: Appropriate  Engagement in Group:  Engaged  Modes of Intervention:  Orientation  Additional Comments:  Pt attended orientation/goals group facilitated by MHT AJ.  Dellia NimsJaquesha M Aberdeen Hafen 04/30/2018, 9:33 AM

## 2018-04-30 NOTE — Progress Notes (Signed)
D:  Patient's self inventory sheet, patient has fair sleep, medication helpful.  Fair appetite, low energy level, poor concentration.  Rated depression and anxiety 8, hopeless 7.  Denied withdrawals.  Denied SI.  Physical problems, pain, worst pain #8 in past 24 hours, headaches.  Pain medication helpful.  Goal is discharge.  Will talk to MD/SW.  Does have discharge plans. A:  Medications administered per MD orders.  Emotional support and encouragement given patient. R:  Denied SI and HI, contracts for safety.  Denied A/V hallucinations.  Safety maintained with 15 minute checks.

## 2018-04-30 NOTE — Tx Team (Signed)
Interdisciplinary Treatment and Diagnostic Plan Update  04/30/2018 Time of Session: 0830AM Walter Nicholson MRN: [id number]  Principal Diagnosis: MDD, recurrent, severe  Secondary Diagnoses: Principal Problem:   Bipolar I disorder, severe, current or most recent episode depressed, with melancholic features [facility](HCC) Active Problems:   Hypothyroidism   Current Medications:  Current Facility-Administered Medications  Medication Dose Route Frequency Provider Last Rate Last Dose  . acetaminophen (TYLENOL) tablet 650 mg  650 mg Oral Q6H PRN Truman HaywardStarkes, Takia S, FNP   650 mg at 04/29/18 2139  . alum & mag hydroxide-simeth (MAALOX/MYLANTA) 200-200-20 MG/5ML suspension 30 mL  30 mL Oral Q4H PRN Truman HaywardStarkes, Takia S, FNP      . citalopram (CELEXA) tablet 40 mg  40 mg Oral Daily Micheal Likensainville, Christopher T, MD   40 mg at 04/30/18 0813  . gabapentin (NEURONTIN) capsule 800 mg  800 mg Oral TID Truman HaywardStarkes, Takia S, FNP   800 mg at 04/30/18 0814  . lamoTRIgine (LAMICTAL) tablet 200 mg  200 mg Oral BID Truman HaywardStarkes, Takia S, FNP   200 mg at 04/30/18 [id number]  . levothyroxine (SYNTHROID, LEVOTHROID) tablet 75 mcg  75 mcg Oral QAC breakfast Money, Gerlene Burdockravis B, FNP   75 mcg at 04/30/18 [id number]  . lisinopril (PRINIVIL,ZESTRIL) tablet 20 mg  20 mg Oral Daily Truman HaywardStarkes, Takia S, FNP   20 mg at 04/30/18 0814  . magnesium hydroxide (MILK OF MAGNESIA) suspension 30 mL  30 mL Oral Daily PRN Starkes, Takia S, FNP      . mirtazapine (REMERON) tablet 30 mg  30 mg Oral QHS Micheal Likensainville, Christopher T, MD   30 mg at 04/29/18 2138  . nicotine (NICODERM CQ - dosed in mg/24 hours) patch 21 mg  21 mg Transdermal Daily Cobos, Rockey SituFernando A, MD   21 mg at 04/30/18 0815  . pantoprazole (PROTONIX) EC tablet 40 mg  40 mg Oral BID Truman HaywardStarkes, Takia S, FNP   40 mg at 04/30/18 0816  . sucralfate (CARAFATE) tablet 1 g  1 g Oral TID WC & HS Micheal Likensainville, Christopher T, MD   1 g at 04/30/18 0815   PTA Medications: Medications Prior to Admission  Medication Sig Dispense  Refill Last Dose  . calcium carbonate (TUMS EX) 750 MG chewable tablet Chew 1 tablet by mouth daily as needed for heartburn.   PRN  . citalopram (CELEXA) 40 MG tablet Take 40 mg by mouth daily.   04/22/2018 at Unknown time  . diclofenac (VOLTAREN) 50 MG EC tablet Take 50 mg by mouth 2 (two) times daily.   04/22/2018 at AM  . gabapentin (NEURONTIN) 800 MG tablet Take 800 mg by mouth 3 (three) times daily.   04/22/2018 at AM  . lamoTRIgine (LAMICTAL) 100 MG tablet Take 100 mg by mouth 2 (two) times daily.   04/22/2018 at AM  . levothyroxine (SYNTHROID, LEVOTHROID) 100 MCG tablet Take 100 mcg by mouth daily before breakfast.   04/22/2018 at Unknown time  . lisinopril (PRINIVIL,ZESTRIL) 20 MG tablet Take 20 mg by mouth daily.   04/22/2018 at Unknown time  . mirtazapine (REMERON) 15 MG tablet Take 15 mg by mouth at bedtime.   04/21/2018 at Unknown time  . Multiple Vitamins-Minerals (CENTRUM ADULTS PO) Take 1 tablet by mouth daily.   04/22/2018 at Unknown time  . naproxen sodium (ALEVE) 220 MG tablet Take 440 mg by mouth daily as needed (for pain or headache).   04/21/2018 at Unknown time  . pantoprazole (PROTONIX) 40 MG tablet Take 40 mg by mouth  2 (two) times daily.   04/22/2018 at AM  . sucralfate (CARAFATE) 1 g tablet Take 1 g by mouth 4 (four) times daily -  with meals and at bedtime.   04/22/2018 at AM  . traMADol (ULTRAM) 50 MG tablet Take 50 mg by mouth 3 (three) times daily as needed for moderate pain.   04/22/2018 at AM    Patient Stressors: Financial difficulties Health problems Substance abuse  Patient Strengths: Ability for insight Average or above average intelligence General fund of knowledge  Treatment Modalities: Medication Management, Group therapy, Case management,  1 to 1 session with clinician, Psychoeducation, Recreational therapy.   Physician Treatment Plan for Primary Diagnosis: MDD, recurrent, severe  Medication Management: Evaluate patient's response, side effects, and tolerance  of medication regimen.  Therapeutic Interventions: 1 to 1 sessions, Unit Group sessions and Medication administration.  Evaluation of Outcomes: Progressing  Physician Treatment Plan for Secondary Diagnosis: Principal Problem:   Bipolar I disorder, severe, current or most recent episode depressed, with melancholic features (HCC) Active Problems:   Hypothyroidism   Medication Management: Evaluate patient's response, side effects, and tolerance of medication regimen.  Therapeutic Interventions: 1 to 1 sessions, Unit Group sessions and Medication administration.  Evaluation of Outcomes: Progressing   RN Treatment Plan for Primary Diagnosis: MDD, recurrent, severe Long Term Goal(s): Knowledge of disease and therapeutic regimen to maintain health will improve  Short Term Goals: Ability to remain free from injury will improve, Ability to demonstrate self-control, Ability to verbalize feelings will improve, Ability to disclose and discuss suicidal ideas and Ability to identify and develop effective coping behaviors will improve  Medication Management: RN will administer medications as ordered by provider, will assess and evaluate patient's response and provide education to patient for prescribed medication. RN will report any adverse and/or side effects to prescribing provider.  Therapeutic Interventions: 1 on 1 counseling sessions, Psychoeducation, Medication administration, Evaluate responses to treatment, Monitor vital signs and CBGs as ordered, Perform/monitor CIWA, COWS, AIMS and Fall Risk screenings as ordered, Perform wound care treatments as ordered.  Evaluation of Outcomes: Progressing   LCSW Treatment Plan for Primary Diagnosis: MDD, recurrent, severe Long Term Goal(s): Safe transition to appropriate next level of care at discharge, Engage patient in therapeutic group addressing interpersonal concerns.  Short Term Goals: Engage patient in aftercare planning with referrals and  resources, Facilitate patient progression through stages of change regarding substance use diagnoses and concerns and Identify triggers associated with mental health/substance abuse issues  Therapeutic Interventions: Assess for all discharge needs, 1 to 1 time with Social worker, Explore available resources and support systems, Assess for adequacy in community support network, Educate family and significant other(s) on suicide prevention, Complete Psychosocial Assessment, Interpersonal group therapy.  Evaluation of Outcomes: Progressing   Progress in Treatment: Attending groups: Yes. Participating in groups: Yes. Taking medication as prescribed: Yes. Toleration medication: Yes. Family/Significant other contact made: SPE completed with pt; pt declined to consent to collateral contact.  Patient understands diagnosis: Yes. Discussing patient identified problems/goals with staff: Yes. Medical problems stabilized or resolved: Yes. Denies suicidal/homicidal ideation: Yes. Issues/concerns per patient self-inventory: No. Other: n/a   New problem(s) identified: No, Describe:  n/a  New Short Term/Long Term Goal(s): detox, medication management for mood stabilization; elimination of SI thoughts; development of comprehensive mental wellness/sobriety plan.   Patient Goals:  "I want to stop drinking and get into a long term place or a halfway house."   Discharge Plan or Barriers: . Pt interested in [facility] referral,  and has been provided with [employer] and [facility]. [facility] possibly for outpatient mental health care.   Reason for Continuation of Hospitalization: Anxiety Depression Medication stabilization  Estimated Length of Stay: Tuesday, 05/01/18  Attendees: Patient: 04/30/2018 8:42 AM  Physician: Dr. Jola Babinski MD; Dr. Altamese Baxter MD 04/30/2018 8:42 AM  Nursing: Meriam Sprague RN; Olivette RN 04/30/2018 8:42 AM  RN Care Manager:x 04/30/2018 8:42 AM  Social Worker: Corrie Mckusick LCSW 04/30/2018 8:42 AM  Recreational Therapist: x 04/30/2018 8:42 AM  Other: Hillery Jacks NP 04/30/2018 8:42 AM  Other:  04/30/2018 8:42 AM  Other: 04/30/2018 8:42 AM    Scribe for Treatment Team: Rona Ravens, LCSW 04/30/2018 8:42 AM

## 2018-04-30 NOTE — Progress Notes (Signed)
Nursing Progress Note: 7p-7a D: Pt currently presents with a sad/flat/depressed affect and behavior. Pt states "I have always been depressed and will always be depressed." Interacting appropriately with the milieu. Pt reports good sleep during the previous night with current medication regimen. Pt did attend wrap-up group.  A: Pt provided with medications per providers orders. Pt's labs and vitals were monitored throughout the night. Pt supported emotionally and encouraged to express concerns and questions. Pt educated on medications.  R: Pt's safety ensured with 15 minute and environmental checks. Pt currently denies SI, HI, and AVH. Pt verbally contracts to seek staff if SI,HI, or AVH occurs and to consult with staff before acting on any harmful thoughts. Will continue to monitor.

## 2018-05-01 MED ORDER — NICOTINE 21 MG/24HR TD PT24: 21.0000 mg | MEDICATED_PATCH | Freq: Every day | TRANSDERMAL | 0 refills | Status: AC

## 2018-05-01 MED ORDER — CITALOPRAM HYDROBROMIDE 40 MG PO TABS: 40.0000 mg | ORAL_TABLET | Freq: Every day | ORAL | 0 refills | Status: AC

## 2018-05-01 MED ORDER — MIRTAZAPINE 30 MG PO TABS: 30.0000 mg | ORAL_TABLET | Freq: Every day | ORAL | 0 refills | Status: AC

## 2018-05-01 MED ORDER — SUCRALFATE 1 G PO TABS: 1.0000 g | ORAL_TABLET | Freq: Three times a day (TID) | ORAL | 0 refills | Status: AC

## 2018-05-01 MED ORDER — HYDROXYZINE HCL 50 MG PO TABS: 50.0000 mg | ORAL_TABLET | Freq: Four times a day (QID) | ORAL | 0 refills | Status: DC | PRN

## 2018-05-01 MED ORDER — LISINOPRIL 20 MG PO TABS: 20.0000 mg | ORAL_TABLET | Freq: Every day | ORAL | 0 refills | Status: AC

## 2018-05-01 MED ORDER — PANTOPRAZOLE SODIUM 40 MG PO TBEC: 40.0000 mg | DELAYED_RELEASE_TABLET | Freq: Two times a day (BID) | ORAL | 0 refills | Status: DC

## 2018-05-01 MED ORDER — GABAPENTIN 400 MG PO CAPS: 800.0000 mg | ORAL_CAPSULE | Freq: Three times a day (TID) | ORAL | 0 refills | Status: AC

## 2018-05-01 MED ORDER — LAMOTRIGINE 200 MG PO TABS: 200.0000 mg | ORAL_TABLET | Freq: Two times a day (BID) | ORAL | 0 refills | Status: AC

## 2018-05-01 MED ORDER — LEVOTHYROXINE SODIUM 75 MCG PO TABS: 75.0000 ug | ORAL_TABLET | Freq: Every day | ORAL | 0 refills | Status: DC

## 2018-05-01 NOTE — Progress Notes (Signed)
CSW and pt contacted Winferd Humphreyee Gray ([facility]) to confirm that he will be picking pt up this afternoon (at 2pm). Pt aware that he is to follow-up at the [facility]. Pt provided with AA/NA information, [facility] contact information, and MHAG pamphlet for additional community support.   Gara Kincade S. Alan RipperHolloway, MSW, LCSW Clinical Social Worker 05/01/2018 9:11 AM

## 2018-05-01 NOTE — [facility] Suicide Risk Assessment (Signed)
[facility] Discharge Suicide Risk Assessment   Principal Problem: Bipolar I disorder, severe, current or most recent episode depressed, with melancholic features [facility]) Discharge Diagnoses:  Patient Active Problem List   Diagnosis Date Noted  . Hypothyroidism [E03.9] 04/28/2018  . Bipolar I disorder, severe, current or most recent episode depressed, with melancholic features (HCC) [F31.4] 04/24/2018  . Major depressive disorder, recurrent, severe without psychotic features (HCC) [F33.2] 04/23/2018    Total Time spent with patient: 30 minutes  Musculoskeletal: Strength & Muscle Tone: within normal limits Gait & Station: normal Patient leans: N/A  Psychiatric Specialty Exam: Review of Systems  Constitutional: Negative for chills and fever.  Respiratory: Negative for cough and shortness of breath.   Cardiovascular: Negative for chest pain.  Gastrointestinal: Negative for abdominal pain, heartburn, nausea and vomiting.  Psychiatric/Behavioral: Negative for depression, hallucinations and suicidal ideas. The patient is not nervous/anxious and does not have insomnia.     Blood pressure (!) 154/81, pulse 78, temperature 98.5 F (36.9 C), temperature source Oral, resp. rate 18, height 5\' 4"  (1.626 m), weight 58.1 kg.Body mass index is 21.97 kg/m.  General Appearance: Casual and Fairly Groomed  Patent attorney::  Good  Speech:  Clear and Coherent and Normal Rate  Volume:  Normal  Mood:  Euthymic  Affect:  Congruent and Constricted  Thought Process:  Coherent and Goal Directed  Orientation:  Full (Time, Place, and Person)  Thought Content:  Logical  Suicidal Thoughts:  No  Homicidal Thoughts:  No  Memory:  Immediate;   Fair Recent;   Fair Remote;   Fair  Judgement:  Fair  Insight:  Lacking  Psychomotor Activity:  Normal  Concentration:  Fair  Recall:  [employer] of Knowledge:Fair  Language: Fair  Akathisia:  No  Handed:    AIMS (if indicated):     Assets:  Resilience Social Support   Sleep:  Number of Hours: 6.5  Cognition: WNL  ADL's:  Intact   Mental Status Per Nursing Assessment::   On Admission:  Suicidal ideation indicated by patient, Self-harm thoughts  Demographic Factors:  Male, Caucasian, Low socioeconomic status, Living alone and Unemployed  Loss Factors: Financial problems/change in socioeconomic status  Historical Factors: Family history of mental illness or substance abuse and Impulsivity  Risk Reduction Factors:   Positive social support, Positive therapeutic relationship and Positive coping skills or problem solving skills  Continued Clinical Symptoms:  Severe Anxiety and/or Agitation Bipolar Disorder:   Depressive phase Alcohol/Substance Abuse/Dependencies  Cognitive Features That Contribute To Risk:  None    Suicide Risk:  Minimal: No identifiable suicidal ideation.  Patients presenting with no risk factors but with morbid ruminations; may be classified as minimal risk based on the severity of the depressive symptoms  Follow-up Information    [facility] Follow up.   Specialty:  Addiction Medicine Why:  Referral made. Your are in review currently and no bed available at this time. If you are still interested in treatment at [facility], please contact Shayla in admissions at discharge to check status of referral and waitlist. Thank you.  Contact information: [address] [place] [address] [phone]        [facility] Follow up on 05/03/2018.   Specialty:  Behavioral Health Why:  Assessment with Mr. [facility] for medication management/substance abuse intensive outpatient program on Thursday, 9/26 at 11:00AM. Please bring photo ID and medicare card to this appt. Thank you.  Contact information: [address] [place]  [phone]         Subjective Data:  William DaltonJames Bivens is a 67 y/o M with history of MDD (elsewhere Bipolar I) and alcohol use disorder who was admitted  voluntarily from [facility] where he presented with worsening depression, SI with multiple plans, and recent discharge from inpatient substance use facility. Pt was medically cleared and [facility] and [facility]. He agreedto increase dose of remeron to address depression, and he wascontinued onlamictal, celexa, gabapentin, and other home medications without changes. He has been working with SW team in regards to referral to substance use treatment. He has been reporting incremental improvement of his presenting symptoms.  Uponevaluation today, pt shares, "I'm alright." He shares that he has decided to stay at [facility]" and he is looking forward to this opportunity, but he still has some minor anxiety. He denies other concerns today. He is sleeping well. His appetite is good. He denies SI/HI/AH/VH. He is tolerating his medications well, and he is in agreement to continue his current regimen without changes. He is in agreement to follow up at [facility]. He was able to engage in safety planning including plan to [facility] or contact emergency services if he feels unable to maintain his own safety or the safety of others. Pt had no further questions, comments, or concerns.    Plan Of Care/Follow-up recommendations:   - Discharge to outpatient level of care  -Bipolar I, current episode depressed - Continue Citalopram 40 mg Po daily. - Continue Mirtazapine 30 mg po Q hs.    - Continue Lamictal 200 mg po bid  -Anxiety - Continue Gabapentin 800 mg po tid.    -Continue vistaril 50mg  po q6h prn anxiety  Other medical issues. - Continue Synthroid 100 mcg po Q am for hypothyroidism. - Continue Lisinopril 20 mg po daily for HTN. - Continue Protonix 40 mg po Q am. -Continue carafate 1g QID with meals and at bedtime  Activity:  as tolerated Diet:  normal Tests:  NA Other:  see above for DC plan  Micheal Likenshristopher T  Markeisha Mancias, MD 05/01/2018, 11:05 AM

## 2018-05-01 NOTE — Progress Notes (Signed)
D:  Patient's self inventory sheet, patient has fair sleep, sleep medication helpful.  Poor appetite, low energy level, poor concentration.  Rated depression, hopeless and anxiety #7.  Denied withdrawals.  Denied SI.  Physical problems, headaches, pain, neck, shoulders, worst pain #7 in past 24 hours.  Pain medication helpful.  [facility]  Plans to discharge.  [facility]. A:  Medications administered per MD orders.  Emotional support and encouragement given patient. R:  Denied SI and HI, contracts for safety.  Denied A/V hallucinations.  Safety maintained with 15 minute checks.

## 2018-05-01 NOTE — Discharge Summary (Addendum)
Physician Discharge Summary Note  Patient:  NESTA KIMPLE is an 67 y.o., male  MRN:  [id number]  DOB:  22-Jun-1951  Patient phone:  [phone] (home)   Patient address:   [address] [place] [address],   Total Time spent with patient: Greater than 30 minutes  Date of Admission:  04/24/2018  Date of Discharge: 05-01-18  Reason for Admission: Worsening depression, SI with multiple plans,  Principal Problem: Bipolar I disorder, severe, current or most recent episode depressed, with melancholic features [facility])  Discharge Diagnoses: Patient Active Problem List   Diagnosis Date Noted  . Hypothyroidism [E03.9] 04/28/2018  . Bipolar I disorder, severe, current or most recent episode depressed, with melancholic features (HCC) [F31.4] 04/24/2018  . Major depressive disorder, recurrent, severe without psychotic features (HCC) [F33.2] 04/23/2018   Past Psychiatric History: Bipolar 1 disorder  Past Medical History:  Past Medical History:  Diagnosis Date  . Depression   . Hyperthyroidism    History reviewed. No pertinent surgical history.  Family History: History reviewed. No pertinent family history.  Family Psychiatric  History: See H&P  Social History:  Social History   Substance and Sexual Activity  Alcohol Use Yes     Social History   Substance and Sexual Activity  Drug Use Not Currently    Social History   Socioeconomic History  . Marital status: Single    Spouse name: Not on file  . Number of children: Not on file  . Years of education: Not on file  . Highest education level: Not on file  Occupational History  . Not on file  Social Needs  . Financial resource strain: Not on file  . Food insecurity:    Worry: Not on file    Inability: Not on file  . Transportation needs:    Medical: Not on file    Non-medical: Not on file  Tobacco Use  . Smoking status: Current Every Day Smoker  . Smokeless tobacco: Never Used  Substance and Sexual  Activity  . Alcohol use: Yes  . Drug use: Not Currently  . Sexual activity: Not Currently  Lifestyle  . Physical activity:    Days per week: Not on file    Minutes per session: Not on file  . Stress: Not on file  Relationships  . Social connections:    Talks on phone: Not on file    Gets together: Not on file    Attends religious service: Not on file    Active member of club or organization: Not on file    Attends meetings of clubs or organizations: Not on file    Relationship status: Not on file  Other Topics Concern  . Not on file  Social History Narrative  . Not on file   Hospital Course: (Per Md's discharge SRA): Shameek Nyquist is a 67 y/o M with history of MDD (elsewhere Bipolar I) and alcohol use disorder who was admitted voluntarily from [facility] where he presented with worsening depression, SI with multiple plans, and recent discharge from inpatient substance use facility. Pt was medically cleared and then transferred to [facility] for additional treatment and stabilization. He agreedto increase dose of remeron to address depression, and he wascontinued onlamictal, celexa, gabapentin, and other home medications without changes. He has been working with SW team in regards to referral to substance use treatment.He has been reporting incremental improvement of his presenting symptoms.  Uponthis discharge evaluation today, pt shares, "I'malright." He shares that he has decided  to [facility] at [facility] and he is looking forward to this opportunity, but he still has some minor anxiety. He denies other concerns today. He is sleeping well. His appetite is good. He denies SI/HI/AH/VH. He is tolerating his medications well, and he is in agreement to continue his current regimen without changes.He is in agreement to follow up at [facility]. He was able to engage in safety planning including plan to return to [facility] or contact emergency services if he feels unable to maintain his own safety or  the safety of others. Pt had no further questions, comments, or concerns.  Plan Of Care/Follow-up recommendations:   - Discharge to outpatient level of care  -Bipolar I, current episode depressed - Continue Citalopram 40 mg Po daily. - Continue Mirtazapine 30 mg po Q hs. - Continue Lamictal 200 mg po bid  -Anxiety -Continue Gabapentin 800 mg po tid. -Continue vistaril 50mg  po q6h prn anxiety  Other medical issues. - Continue Synthroid 100 mcg po Q am for hypothyroidism. - Continue Lisinopril 20 mg po daily for HTN. - Continue Protonix 40 mg po Q am. -Continue carafate 1g QID with meals and at bedtime  Activity:  as tolerated Diet:  normal Tests:  NA Other:  see above for DC plan  Physical Findings: AIMS: Facial and Oral Movements Muscles of Facial Expression: None, normal Lips and Perioral Area: None, normal Jaw: None, normal Tongue: None, normal,Extremity Movements Upper (arms, wrists, hands, fingers): None, normal Lower (legs, knees, ankles, toes): None, normal, Trunk Movements Neck, shoulders, hips: None, normal, Overall Severity Severity of abnormal movements (highest score from questions above): None, normal Incapacitation due to abnormal movements: None, normal Patient's awareness of abnormal movements (rate only patient's report): No Awareness, Dental Status Current problems with teeth and/or dentures?: Yes Does patient usually wear dentures?: No  CIWA:  CIWA-Ar Total: 1 COWS:  COWS Total Score: 1  Musculoskeletal: Strength & Muscle Tone: within normal limits Gait & Station: normal Patient leans: N/A  Psychiatric Specialty Exam: Physical Exam  Constitutional: He appears well-developed.  HENT:  Head: Normocephalic.  Eyes: Pupils are equal, round, and reactive to light.  Neck: Normal range of motion.  Cardiovascular: Normal rate.  Respiratory: Effort normal.  GI: Soft.  Genitourinary:  Genitourinary Comments:  Deferred  Musculoskeletal: Normal range of motion.  Neurological: He is alert.  Skin: Skin is warm.    Review of Systems  Constitutional: Negative.   HENT: Negative.   Eyes: Negative.   Respiratory: Negative.   Cardiovascular: Negative.   Gastrointestinal: Negative.   Genitourinary: Negative.   Musculoskeletal: Negative.   Skin: Negative.   Neurological: Negative.   Endo/Heme/Allergies: Negative.   Psychiatric/Behavioral: Positive for depression (Stable) and substance abuse (Hx. alcoholism, chronic). Negative for hallucinations, memory loss and suicidal ideas. The patient has insomnia (Stabel). The patient is not nervous/anxious.     Blood pressure (!) 154/81, pulse 78, temperature 98.5 F (36.9 C), temperature source Oral, resp. rate 18, height 5\' 4"  (1.626 m), weight 58.1 kg.Body mass index is 21.97 kg/m.  See H&P   Have you used any form of tobacco in the last 30 days? (Cigarettes, Smokeless Tobacco, Cigars, and/or Pipes): Yes  Has this patient used any form of tobacco in the last 30 days? (Cigarettes, Smokeless Tobacco, Cigars, and/or Pipes): Yes, an FDA-approved tobacco cessation medication was offered at discharge.  Blood Alcohol level:  Lab Results  Component Value Date   ETH <10 04/22/2018   Metabolic Disorder Labs:  Lab Results  Component Value Date   HGBA1C 5.7 (H) 04/25/2018   MPG 117 04/25/2018   No results found for: PROLACTIN Lab Results  Component Value Date   CHOL 185 04/25/2018   TRIG 171 (H) 04/25/2018   HDL 43 04/25/2018   CHOLHDL 4.3 04/25/2018   VLDL 34 04/25/2018   LDLCALC 108 (H) 04/25/2018   See Psychiatric Specialty Exam and Suicide Risk Assessment completed by Attending Physician prior to discharge.  Discharge destination:  Home  Is patient on multiple antipsychotic therapies at discharge:  No   Has Patient had three or more failed trials of antipsychotic monotherapy by history:  No  Recommended Plan for Multiple Antipsychotic  Therapies: NA  Allergies as of 05/01/2018   No Known Allergies     Medication List    STOP taking these medications   calcium carbonate 750 MG chewable tablet Commonly known as:  TUMS EX   CENTRUM ADULTS PO   diclofenac 50 MG EC tablet Commonly known as:  VOLTAREN   gabapentin 800 MG tablet Commonly known as:  NEURONTIN Replaced by:  gabapentin 400 MG capsule   naproxen sodium 220 MG tablet Commonly known as:  ALEVE   traMADol 50 MG tablet Commonly known as:  ULTRAM     TAKE these medications     Indication  citalopram 40 MG tablet Commonly known as:  CELEXA Take 1 tablet (40 mg total) by mouth daily. For depression What changed:  additional instructions  Indication:  Depression   gabapentin 400 MG capsule Commonly known as:  NEURONTIN Take 2 capsules (800 mg total) by mouth 3 (three) times daily. For agitation Replaces:  gabapentin 800 MG tablet  Indication:  Agitation   hydrOXYzine 50 MG tablet Commonly known as:  ATARAX/VISTARIL Take 1 tablet (50 mg total) by mouth every 6 (six) hours as needed for anxiety.  Indication:  Feeling Anxious   lamoTRIgine 200 MG tablet Commonly known as:  LAMICTAL Take 1 tablet (200 mg total) by mouth 2 (two) times daily. For mood stabilization What changed:    medication strength  how much to take  additional instructions  Indication:  Mood stabilization   levothyroxine 75 MCG tablet Commonly known as:  SYNTHROID, LEVOTHROID Take 1 tablet (75 mcg total) by mouth daily before breakfast. For thyroid hormone replacement Start taking on:  05/02/2018 What changed:    medication strength  how much to take  additional instructions  Indication:  Underactive Thyroid   lisinopril 20 MG tablet Commonly known as:  PRINIVIL,ZESTRIL Take 1 tablet (20 mg total) by mouth daily. For high blood pressure What changed:  additional instructions  Indication:  High Blood Pressure Disorder   mirtazapine 30 MG tablet Commonly known  as:  REMERON Take 1 tablet (30 mg total) by mouth at bedtime. For depression What changed:    medication strength  how much to take  additional instructions  Indication:  Major Depressive Disorder   nicotine 21 mg/24hr patch Commonly known as:  NICODERM CQ - dosed in mg/24 hours Place 1 patch (21 mg total) onto the skin daily. (May buy from over the counter): For nicotine withdrawal Start taking on:  05/02/2018  Indication:  Nicotine Addiction   pantoprazole 40 MG tablet Commonly known as:  PROTONIX Take 1 tablet (40 mg total) by mouth 2 (two) times daily. For acid reflux What changed:  additional instructions  Indication:  Gastroesophageal Reflux Disease   sucralfate 1 g tablet Commonly known as:  CARAFATE Take 1 tablet (1 g  total) by mouth 4 (four) times daily -  with meals and at bedtime. For Ulcer What changed:  additional instructions  Indication:  Ulcer of the Duodenum      Follow-up Information    [facility] Follow up.   Specialty:  Addiction Medicine Why:  Referral made. Your are in review currently and no bed available at this time. If you are still interested in treatment at [facility], please contact Shayla in admissions at discharge to check status of referral and waitlist. Thank you.  Contact information: [address] [place] [address] [phone]        [facility] Follow up on 05/03/2018.   Specialty:  Behavioral Health Why:  Assessment with Mr. [facility] for medication management/substance abuse intensive outpatient program on Thursday, 9/26 at 11:00AM. Please bring photo ID and medicare card to this appt. Thank you.  Contact information: [address] [place] [address] [phone]          Follow-up recommendations: Activity:  As tolerated Diet: As recommended by your primary care doctor. Keep all scheduled follow-up appointments as recommended.   Comments: Patient is instructed prior to  discharge to: Take all medications as prescribed by his/her mental healthcare provider. Report any adverse effects and or reactions from the medicines to his/her outpatient provider promptly. Patient has been instructed & cautioned: To not engage in alcohol and or illegal drug use while on prescription medicines. In the event of worsening symptoms, patient is instructed to call the crisis hotline, 911 and or go to the nearest ED for appropriate evaluation and treatment of symptoms. To follow-up with his/her primary care provider for your other medical issues, concerns and or health care needs.   Signed: Armandina Stammer, NP, PMHNP, FNP-BC 05/01/2018, 10:04 AM   Patient seen, Suicide Assessment Completed.  Disposition Plan Reviewed

## 2018-05-01 NOTE — Progress Notes (Signed)
Discharge Note:  Patient discharged home with family member.  Patient denied SI and HI.  Denied A/V hallucinations.  Denied pain.  Suicide prevention information given to patient who stated he understood and had no questions.  Patient stated [facility]  Patient stated he received all his belongings, clothing, toiletries, prescriptions, etc.  Patient completed his survey and placed in appropriate box.  Suicide prevention resources incluidng My3 App given to patient.  All required discharge information given to patient at discharge.

## 2018-05-01 NOTE — BHH Group Notes (Signed)
Adult Psychoeducational Group Note  Date:  05/01/2018 Time:  9:20 AM  Group Topic/Focus:  Goals Group:   The focus of this group is to help patients establish daily goals to achieve during treatment and discuss how the patient can incorporate goal setting into their daily lives to aide in recovery.  Participation Level:  Active  Participation Quality:  Appropriate  Affect:  Appropriate  Cognitive:  Alert  Insight: Appropriate  Engagement in Group:  Engaged  Modes of Intervention:  Orientation  Additional Comments:  Pt participated in orientation/goals group facilitated by AJ.  Dellia NimsJaquesha M Lewin Pellow 05/01/2018, 9:20 AM

## 2018-05-01 NOTE — Progress Notes (Signed)
  [facility] :  Will you be returning to the same living situation after discharge:  No. [facility].  At discharge, do you have transportation home?: Yes,  Winferd Humphreyee [employer] account executive(director of halfway house) will pick up pt at 2pm today.  Do you have the ability to pay for your medications: Yes,  generic medicare  Release of information consent forms completed and submitted to medical records by CSW.   Patient to Follow up at: Follow-up Information    [facility] Follow up.   Specialty:  Addiction Medicine Why:  Referral made. Your are in review currently and no bed available at this time. [facility], please contact Shayla in admissions at discharge to check status of referral and waitlist. Thank you.  Contact information: [address] [place] [address] [phone]        [facility] Follow up on 05/03/2018.   Specialty:  Behavioral Health Why:  Assessment with Mr. [facility] for medication management/substance abuse intensive outpatient program on Thursday, 9/26 at 11:00AM. Please bring photo ID and medicare card to this appt. Thank you.  Contact information: [address] [place] [address] [phone]           Next level of care provider has [facility] Link:no  Safety Planning and Suicide Prevention discussed: Yes,  SPE completed with pt; pt declined to consent to collateral contact SPI pamphlet and Mobile Crisis information provided.   Have you used any form of tobacco in the last 30 days? (Cigarettes, Smokeless Tobacco, Cigars, and/or Pipes): Yes  Has patient been referred to the Quitline?: Patient refused referral  Patient has been referred for addiction treatment: Yes  Rona RavensHeather S Hagar Sadiq, LCSW 05/01/2018, 9:11 AM

## 2018-05-02 ENCOUNTER — Other Ambulatory Visit: Payer: Self-pay

## 2018-05-02 ENCOUNTER — Encounter (HOSPITAL_COMMUNITY): Payer: Self-pay | Admitting: *Deleted

## 2018-05-02 ENCOUNTER — Emergency Department (HOSPITAL_COMMUNITY)
Admission: EM | Admit: 2018-05-02 | Discharge: 2018-05-03 | Disposition: A | Discharge status: Home / Self Care | Payer: Medicare (Managed Care) | Attending: Emergency Medicine | Admitting: Emergency Medicine

## 2018-05-02 DIAGNOSIS — Z79899 Other long term (current) drug therapy: Secondary | ICD-10-CM | POA: Diagnosis not present

## 2018-05-02 DIAGNOSIS — F329 Major depressive disorder, single episode, unspecified: Secondary | ICD-10-CM | POA: Diagnosis present

## 2018-05-02 DIAGNOSIS — F332 Major depressive disorder, recurrent severe without psychotic features: Secondary | ICD-10-CM

## 2018-05-02 DIAGNOSIS — F1010 Alcohol abuse, uncomplicated: Secondary | ICD-10-CM | POA: Diagnosis not present

## 2018-05-02 DIAGNOSIS — R45851 Suicidal ideations: Secondary | ICD-10-CM | POA: Insufficient documentation

## 2018-05-02 DIAGNOSIS — F419 Anxiety disorder, unspecified: Secondary | ICD-10-CM | POA: Diagnosis not present

## 2018-05-02 DIAGNOSIS — F314 Bipolar disorder, current episode depressed, severe, without psychotic features: Secondary | ICD-10-CM | POA: Diagnosis not present

## 2018-05-02 DIAGNOSIS — F17210 Nicotine dependence, cigarettes, uncomplicated: Secondary | ICD-10-CM | POA: Diagnosis not present

## 2018-05-02 DIAGNOSIS — E039 Hypothyroidism, unspecified: Secondary | ICD-10-CM | POA: Diagnosis not present

## 2018-05-02 LAB — COMPREHENSIVE METABOLIC PANEL
ALK PHOS: 70 U/L (ref 38–126)
ALT: 12 U/L (ref 0–44)
ANION GAP: 11 (ref 5–15)
AST: 16 U/L (ref 15–41)
Albumin: 4.2 g/dL (ref 3.5–5.0)
BILIRUBIN TOTAL: 0.5 mg/dL (ref 0.3–1.2)
BUN: 15 mg/dL (ref 8–23)
CALCIUM: 9.7 mg/dL (ref 8.9–10.3)
CO2: 27 mmol/L (ref 22–32)
CREATININE: 0.85 mg/dL (ref 0.61–1.24)
Chloride: 104 mmol/L (ref 98–111)
GFR calc non Af Amer: >60 mL/min (ref >60)
Glucose, Bld: 97 mg/dL (ref 70–99)
Potassium: 3.4 mmol/L — ABNORMAL LOW (ref 3.5–5.1)
Sodium: 142 mmol/L (ref 135–145)
TOTAL PROTEIN: 7.5 g/dL (ref 6.5–8.1)

## 2018-05-02 LAB — CBC
HCT: 33.4 % — ABNORMAL LOW (ref 39.0–52.0)
Hemoglobin: 10.7 g/dL — ABNORMAL LOW (ref 13.0–17.0)
MCH: 25.1 pg — AB (ref 26.0–34.0)
MCHC: 32 g/dL (ref 30.0–36.0)
MCV: 78.4 fL (ref 78.0–100.0)
Platelets: 423 10*3/uL — ABNORMAL HIGH (ref 150–400)
RBC: 4.26 MIL/uL (ref 4.22–5.81)
RDW: 17.6 % — AB (ref 11.5–15.5)
WBC: 7.7 10*3/uL (ref 4.0–10.5)

## 2018-05-02 LAB — ACETAMINOPHEN LEVEL

## 2018-05-02 LAB — RAPID URINE DRUG SCREEN, HOSP PERFORMED
Amphetamines: NOT DETECTED
BARBITURATES: NOT DETECTED
Benzodiazepines: NOT DETECTED
COCAINE: NOT DETECTED
Opiates: NOT DETECTED
TETRAHYDROCANNABINOL: NOT DETECTED

## 2018-05-02 LAB — ETHANOL: Alcohol, Ethyl (B): <10 mg/dL (ref <10)

## 2018-05-02 LAB — SALICYLATE LEVEL

## 2018-05-02 MED ORDER — ONDANSETRON HCL 4 MG PO TABS: 4.0000 mg | ORAL_TABLET | Freq: Three times a day (TID) | ORAL | Status: DC | PRN

## 2018-05-02 MED ORDER — LISINOPRIL 20 MG PO TABS
20.0000 mg | ORAL_TABLET | Freq: Every day | ORAL | Status: DC
  Administered 2018-05-02 – 2018-05-03 (×2): 20 mg via ORAL
  Filled 2018-05-02 (×2): qty 1

## 2018-05-02 MED ORDER — CITALOPRAM HYDROBROMIDE 10 MG PO TABS
40.0000 mg | ORAL_TABLET | Freq: Every day | ORAL | Status: DC
  Administered 2018-05-02 – 2018-05-03 (×2): 40 mg via ORAL
  Filled 2018-05-02 (×2): qty 4

## 2018-05-02 MED ORDER — LEVOTHYROXINE SODIUM 75 MCG PO TABS
75.0000 ug | ORAL_TABLET | Freq: Every day | ORAL | Status: DC
  Administered 2018-05-03: 75 ug via ORAL
  Filled 2018-05-02: qty 1

## 2018-05-02 MED ORDER — MIRTAZAPINE 30 MG PO TABS
30.0000 mg | ORAL_TABLET | Freq: Every day | ORAL | Status: DC
  Administered 2018-05-02: 30 mg via ORAL
  Filled 2018-05-02: qty 1

## 2018-05-02 MED ORDER — PANTOPRAZOLE SODIUM 40 MG PO TBEC
40.0000 mg | DELAYED_RELEASE_TABLET | Freq: Two times a day (BID) | ORAL | Status: DC
  Administered 2018-05-02 – 2018-05-03 (×2): 40 mg via ORAL
  Filled 2018-05-02 (×2): qty 1

## 2018-05-02 MED ORDER — GABAPENTIN 400 MG PO CAPS
800.0000 mg | ORAL_CAPSULE | Freq: Three times a day (TID) | ORAL | Status: DC
  Administered 2018-05-02 – 2018-05-03 (×2): 800 mg via ORAL
  Filled 2018-05-02 (×2): qty 2

## 2018-05-02 MED ORDER — LAMOTRIGINE 100 MG PO TABS
200.0000 mg | ORAL_TABLET | Freq: Two times a day (BID) | ORAL | Status: DC
  Administered 2018-05-02 – 2018-05-03 (×2): 200 mg via ORAL
  Filled 2018-05-02 (×2): qty 2

## 2018-05-02 MED ORDER — TRAMADOL HCL 50 MG PO TABS: 50.0000 mg | ORAL_TABLET | Freq: Three times a day (TID) | ORAL | Status: DC | PRN

## 2018-05-02 MED ORDER — HYDROXYZINE HCL 25 MG PO TABS: 50.0000 mg | ORAL_TABLET | Freq: Four times a day (QID) | ORAL | Status: DC | PRN

## 2018-05-02 MED ORDER — SUCRALFATE 1 G PO TABS
1.0000 g | ORAL_TABLET | Freq: Three times a day (TID) | ORAL | Status: DC
  Administered 2018-05-02 – 2018-05-03 (×2): 1 g via ORAL
  Filled 2018-05-02 (×3): qty 1

## 2018-05-02 MED ORDER — IBUPROFEN 200 MG PO TABS: 600.0000 mg | ORAL_TABLET | Freq: Three times a day (TID) | ORAL | Status: DC | PRN

## 2018-05-02 NOTE — ED Provider Notes (Signed)
[facility]-EMERGENCY DEPT Provider Note   CSN: [id number] Arrival date & time: 05/02/18  1755     History   Chief Complaint Chief Complaint  Patient presents with  . Suicidal    HPI Walter Nicholson is a 67 y.o. male.  HPI Patient was just discharged from behavioral health yesterday.  He reports he still does not feel better.  He reports he still feels very depressed and having thoughts of suicide.  He denies he has a specific plan.  He has not taken any overdose of his medications.  He denies he has been drinking any alcohol since he left the hospital.  He denies he owns a firearm. Past Medical History:  Diagnosis Date  . Depression   . Hyperthyroidism     Patient Active Problem List   Diagnosis Date Noted  . Hypothyroidism 04/28/2018  . Bipolar I disorder, severe, current or most recent episode depressed, with melancholic features (HCC) 04/24/2018  . Major depressive disorder, recurrent, severe without psychotic features (HCC) 04/23/2018    History reviewed. No pertinent surgical history.      Home Medications    Prior to Admission medications   Medication Sig Start Date End Date Taking? Authorizing Provider  acetaminophen (TYLENOL) 500 MG tablet Take 500 mg by mouth every 6 (six) hours as needed for moderate pain.   Yes [provider]  citalopram (CELEXA) 40 MG tablet Take 1 tablet (40 mg total) by mouth daily. For depression 05/01/18  Yes Armandina Stammer I, NP  diclofenac (VOLTAREN) 75 MG EC tablet Take 75 mg by mouth 2 (two) times daily.   Yes [provider]  gabapentin (NEURONTIN) 400 MG capsule Take 2 capsules (800 mg total) by mouth 3 (three) times daily. For agitation 05/01/18  Yes Armandina Stammer I, NP  levothyroxine (SYNTHROID, LEVOTHROID) 75 MCG tablet Take 1 tablet (75 mcg total) by mouth daily before breakfast. For thyroid hormone replacement 05/02/18  Yes Armandina Stammer I, NP  lisinopril (PRINIVIL,ZESTRIL) 20 MG tablet Take  1 tablet (20 mg total) by mouth daily. For high blood pressure 05/01/18  Yes Nwoko, Agnes I, NP  mirtazapine (REMERON) 30 MG tablet Take 1 tablet (30 mg total) by mouth at bedtime. For depression 05/01/18  Yes Armandina Stammer I, NP  naproxen sodium (ALEVE) 220 MG tablet Take 440 mg by mouth daily as needed (pain).   Yes [provider]  sucralfate (CARAFATE) 1 g tablet Take 1 tablet (1 g total) by mouth 4 (four) times daily -  with meals and at bedtime. For Ulcer 05/01/18  Yes Armandina Stammer I, NP  traMADol (ULTRAM) 50 MG tablet Take 50 mg by mouth 3 (three) times daily as needed for moderate pain.   Yes [provider]  hydrOXYzine (ATARAX/VISTARIL) 50 MG tablet Take 1 tablet (50 mg total) by mouth every 6 (six) hours as needed for anxiety. 05/01/18   Armandina Stammer I, NP  lamoTRIgine (LAMICTAL) 200 MG tablet Take 1 tablet (200 mg total) by mouth 2 (two) times daily. For mood stabilization 05/01/18   Nwoko, Nicole Kindred I, NP  nicotine (NICODERM CQ - DOSED IN MG/24 HOURS) 21 mg/24hr patch Place 1 patch (21 mg total) onto the skin daily. (May buy from over the counter): For nicotine withdrawal Patient not taking: Reported on 05/02/2018 05/02/18   Armandina Stammer I, NP  pantoprazole (PROTONIX) 40 MG tablet Take 1 tablet (40 mg total) by mouth 2 (two) times daily. For acid reflux 05/01/18   Nwoko,  Nelda Marseille, NP    Family History History reviewed. No pertinent family history.  Social History Social History   Tobacco Use  . Smoking status: Current Every Day Smoker  . Smokeless tobacco: Never Used  Substance Use Topics  . Alcohol use: Yes  . Drug use: Not Currently     Allergies   Patient has no known allergies.   Review of Systems Review of Systems 10 Systems reviewed and are negative for acute change except as noted in the HPI.  Physical Exam Updated Vital Signs BP 136/79 (BP Location: Right Arm)   Pulse 83   Temp 98 F (36.7 C) (Oral)   Resp 20   SpO2 97%   Physical Exam    Constitutional: He is oriented to person, place, and time. He appears well-developed and well-nourished.  HENT:  Head: Normocephalic and atraumatic.  Eyes: Pupils are equal, round, and reactive to light. EOM are normal.  Neck: Neck supple.  Cardiovascular: Normal rate, regular rhythm, normal heart sounds and intact distal pulses.  Pulmonary/Chest: Effort normal and breath sounds normal.  Abdominal: Soft. Bowel sounds are normal. He exhibits no distension. There is no tenderness.  Musculoskeletal: Normal range of motion. He exhibits no edema or tenderness.  Neurological: He is alert and oriented to person, place, and time. He has normal strength. He exhibits normal muscle tone. Coordination normal. GCS eye subscore is 4. GCS verbal subscore is 5. GCS motor subscore is 6.  Skin: Skin is warm, dry and intact.  Psychiatric: He has a normal mood and affect.     ED Treatments / Results  Labs (all labs ordered are listed, but only abnormal results are displayed) Labs Reviewed  COMPREHENSIVE METABOLIC PANEL - Abnormal; Notable for the following components:      Result Value   Potassium 3.4 (*)    All other components within normal limits  ACETAMINOPHEN LEVEL - Abnormal; Notable for the following components:   Acetaminophen (Tylenol), Serum <10 (*)    All other components within normal limits  CBC - Abnormal; Notable for the following components:   Hemoglobin 10.7 (*)    HCT 33.4 (*)    MCH 25.1 (*)    RDW 17.6 (*)    Platelets 423 (*)    All other components within normal limits  ETHANOL  SALICYLATE LEVEL  RAPID URINE DRUG SCREEN, HOSP PERFORMED    EKG None  Radiology No results found.  Procedures Procedures (including critical care time)  Medications Ordered in ED Medications  ibuprofen (ADVIL,MOTRIN) tablet 600 mg (has no administration in time range)  ondansetron (ZOFRAN) tablet 4 mg (has no administration in time range)  citalopram (CELEXA) tablet 40 mg (has no  administration in time range)  gabapentin (NEURONTIN) capsule 800 mg (has no administration in time range)  hydrOXYzine (ATARAX/VISTARIL) tablet 50 mg (has no administration in time range)  lamoTRIgine (LAMICTAL) tablet 200 mg (has no administration in time range)  levothyroxine (SYNTHROID, LEVOTHROID) tablet 75 mcg (has no administration in time range)  lisinopril (PRINIVIL,ZESTRIL) tablet 20 mg (has no administration in time range)  mirtazapine (REMERON) tablet 30 mg (has no administration in time range)  pantoprazole (PROTONIX) EC tablet 40 mg (has no administration in time range)  sucralfate (CARAFATE) tablet 1 g (has no administration in time range)  traMADol (ULTRAM) tablet 50 mg (has no administration in time range)     Initial Impression / Assessment and Plan / ED Course  I have reviewed the triage vital signs and the  nursing notes.  Pertinent labs & imaging results that were available during my care of the patient were reviewed by me and considered in my medical decision making (see chart for details).    Consult: Behavioral health counselor advises patient will be for observation overnight in the emergency department.  Patient presents with history of substance abuse but no active use right now.  He is just treated as an inpatient.  Patient reports ongoing severe depression and hopelessness.  He has not attempted to injure himself or overdose on medications at this time.  Medical conditions are stable.  Patient is medically cleared for psychiatric management.  Final Clinical Impressions(s) / ED Diagnoses   Final diagnoses:  Suicidal ideation  Severe episode of recurrent major depressive disorder, without psychotic features [facility](HCC)    ED Discharge Orders    None       Arby BarrettePfeiffer, Caysen Whang, MD 05/02/18 2217

## 2018-05-02 NOTE — ED Triage Notes (Signed)
Patient was driven by co-workers to the hospital. He states he got discharged from [facility] yesterday after a one week stay. Patient is having thoughts of suicide. He does not have a plan. No homicidal thoughts. No recent life disturbances or trauma. He admits to drinking some days but no alcohol today.
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
## 2018-05-03 DIAGNOSIS — F17210 Nicotine dependence, cigarettes, uncomplicated: Secondary | ICD-10-CM

## 2018-05-03 DIAGNOSIS — F419 Anxiety disorder, unspecified: Secondary | ICD-10-CM

## 2018-05-03 DIAGNOSIS — F314 Bipolar disorder, current episode depressed, severe, without psychotic features: Secondary | ICD-10-CM

## 2018-05-03 DIAGNOSIS — E039 Hypothyroidism, unspecified: Secondary | ICD-10-CM

## 2018-05-03 DIAGNOSIS — F1010 Alcohol abuse, uncomplicated: Secondary | ICD-10-CM | POA: Diagnosis present

## 2018-05-03 NOTE — Consult Note (Addendum)
[facility] Psych ED Discharge  05/03/2018 8:46 AM Walter Nicholson  MRN:  [id number] Principal Problem: Alcohol abuse Discharge Diagnoses:  Patient Active Problem List   Diagnosis Date Noted  . Hypothyroidism [E03.9] 04/28/2018  . Bipolar I disorder, severe, current or most recent episode depressed, with melancholic features (HCC) [F31.4] 04/24/2018  . Major depressive disorder, recurrent, severe without psychotic features (HCC) [F33.2] 04/23/2018    Subjective: Pt was seen and chart reviewed with treatment team and Dr Sharma Covert. Pt denies suicidal/homicidal ideation, denies auditory/visual hallucinations and does not appear to be responding to internal stimuli. Pt was discharged from [facility] on 05-01-2018 with instructions to follow up [facility] at 11:00 and to call Shayla at [facility] for an intake appointment as he is under review for admission there. Pt stated he doesn't know how to ride the bus. Pt has many reasons why he can not follow up as instructed. Pt was once again reminded of his discharge plan and will be re-referred to [facility]. Pt was educated about the need to follow up outpatient because inpatient is a short stay for acute stabilization. Pt's UDS and Bal are negative. Pt's remaining labs are unremarkable. Pt is stable and psychiatrically clear for discharge.   Total Time spent with patient: 45 minutes  Past Psychiatric History: As above  Past Medical History:  Past Medical History:  Diagnosis Date  . Depression   . Hyperthyroidism    History reviewed. No pertinent surgical history. Family History: History reviewed. No pertinent family history. Family Psychiatric  History: Unknown Social History:  Social History   Substance and Sexual Activity  Alcohol Use Yes    Social History   Substance and Sexual Activity  Drug Use Not Currently   Social History   Socioeconomic History  . Marital status: Single    Spouse name: Not on file  . Number of children:  Not on file  . Years of education: Not on file  . Highest education level: Not on file  Occupational History  . Not on file  Social Needs  . Financial resource strain: Not on file  . Food insecurity:    Worry: Not on file    Inability: Not on file  . Transportation needs:    Medical: Not on file    Non-medical: Not on file  Tobacco Use  . Smoking status: Current Every Day Smoker  . Smokeless tobacco: Never Used  Substance and Sexual Activity  . Alcohol use: Yes  . Drug use: Not Currently  . Sexual activity: Not Currently  Lifestyle  . Physical activity:    Days per week: Not on file    Minutes per session: Not on file  . Stress: Not on file  Relationships  . Social connections:    Talks on phone: Not on file    Gets together: Not on file    Attends religious service: Not on file    Active member of club or organization: Not on file    Attends meetings of clubs or organizations: Not on file    Relationship status: Not on file  Other Topics Concern  . Not on file  Social History Narrative  . Not on file    Has this patient used any form of tobacco in the last 30 days? (Cigarettes, Smokeless Tobacco, Cigars, and/or Pipes) Prescription not provided because: Pt declined  Current Medications: Current Facility-Administered Medications  Medication Dose Route Frequency Provider Last Rate Last Dose  . citalopram (CELEXA)  tablet 40 mg  40 mg Oral Daily Arby Barrette, MD   40 mg at 05/02/18 2232  . gabapentin (NEURONTIN) capsule 800 mg  800 mg Oral TID Arby Barrette, MD   800 mg at 05/02/18 2233  . hydrOXYzine (ATARAX/VISTARIL) tablet 50 mg  50 mg Oral Q6H PRN Arby Barrette, MD      . ibuprofen (ADVIL,MOTRIN) tablet 600 mg  600 mg Oral Q8H PRN Arby Barrette, MD      . lamoTRIgine (LAMICTAL) tablet 200 mg  200 mg Oral BID Arby Barrette, MD   200 mg at 05/02/18 2233  . levothyroxine (SYNTHROID, LEVOTHROID) tablet 75 mcg  75 mcg Oral QAC breakfast Arby Barrette, MD   75  mcg at 05/03/18 0640  . lisinopril (PRINIVIL,ZESTRIL) tablet 20 mg  20 mg Oral Daily Arby Barrette, MD   20 mg at 05/02/18 2232  . mirtazapine (REMERON) tablet 30 mg  30 mg Oral QHS Arby Barrette, MD   30 mg at 05/02/18 2233  . ondansetron (ZOFRAN) tablet 4 mg  4 mg Oral Q8H PRN Arby Barrette, MD      . pantoprazole (PROTONIX) EC tablet 40 mg  40 mg Oral BID Arby Barrette, MD   40 mg at 05/02/18 2233  . sucralfate (CARAFATE) tablet 1 g  1 g Oral TID WC & HS Arby Barrette, MD   1 g at 05/03/18 0804  . traMADol (ULTRAM) tablet 50 mg  50 mg Oral TID PRN Arby Barrette, MD       Current Outpatient Medications  Medication Sig Dispense Refill  . acetaminophen (TYLENOL) 500 MG tablet Take 500 mg by mouth every 6 (six) hours as needed for moderate pain.    . citalopram (CELEXA) 40 MG tablet Take 1 tablet (40 mg total) by mouth daily. For depression 30 tablet 0  . diclofenac (VOLTAREN) 75 MG EC tablet Take 75 mg by mouth 2 (two) times daily.    Marland Kitchen gabapentin (NEURONTIN) 400 MG capsule Take 2 capsules (800 mg total) by mouth 3 (three) times daily. For agitation 180 capsule 0  . levothyroxine (SYNTHROID, LEVOTHROID) 75 MCG tablet Take 1 tablet (75 mcg total) by mouth daily before breakfast. For thyroid hormone replacement 7 tablet 0  . lisinopril (PRINIVIL,ZESTRIL) 20 MG tablet Take 1 tablet (20 mg total) by mouth daily. For high blood pressure 15 tablet 0  . mirtazapine (REMERON) 30 MG tablet Take 1 tablet (30 mg total) by mouth at bedtime. For depression 30 tablet 0  . naproxen sodium (ALEVE) 220 MG tablet Take 440 mg by mouth daily as needed (pain).    . sucralfate (CARAFATE) 1 g tablet Take 1 tablet (1 g total) by mouth 4 (four) times daily -  with meals and at bedtime. For Ulcer 60 tablet 0  . traMADol (ULTRAM) 50 MG tablet Take 50 mg by mouth 3 (three) times daily as needed for moderate pain.    . hydrOXYzine (ATARAX/VISTARIL) 50 MG tablet Take 1 tablet (50 mg total) by mouth every 6 (six)  hours as needed for anxiety. 60 tablet 0  . lamoTRIgine (LAMICTAL) 200 MG tablet Take 1 tablet (200 mg total) by mouth 2 (two) times daily. For mood stabilization 60 tablet 0  . nicotine (NICODERM CQ - DOSED IN MG/24 HOURS) 21 mg/24hr patch Place 1 patch (21 mg total) onto the skin daily. (May buy from over the counter): For nicotine withdrawal (Patient not taking: Reported on 05/02/2018) 28 patch 0  . pantoprazole (PROTONIX) 40 MG tablet  Take 1 tablet (40 mg total) by mouth 2 (two) times daily. For acid reflux 15 tablet 0   Musculoskeletal: Strength & Muscle Tone: within normal limits Gait & Station: normal Patient leans: N/A  Psychiatric Specialty Exam: Physical Exam  Nursing note and vitals reviewed. Constitutional: He is oriented to person, place, and time. He appears well-developed and well-nourished.  HENT:  Head: Normocephalic and atraumatic.  Neck: Normal range of motion.  Respiratory: Effort normal.  Musculoskeletal: Normal range of motion.  Neurological: He is alert and oriented to person, place, and time.  Psychiatric: His speech is normal and behavior is normal. Judgment and thought content normal. Cognition and memory are normal. He exhibits a depressed mood.    Review of Systems  Psychiatric/Behavioral: Positive for depression. The patient is nervous/anxious.   All other systems reviewed and are negative.   Blood pressure (!) 162/81, pulse 72, temperature 98.2 F (36.8 C), temperature source Oral, resp. rate 15, SpO2 95 %.There is no height or weight on file to calculate BMI.  General Appearance: Casual  Eye Contact:  Good  Speech:  Clear and Coherent and Normal Rate  Volume:  Normal  Mood:  Anxious and Depressed  Affect:  Congruent and Depressed  Thought Process:  Coherent, Goal Directed and Linear  Orientation:  Full (Time, Place, and Person)  Thought Content:  Logical  Suicidal Thoughts:  No  Homicidal Thoughts:  No  Memory:  Immediate;   Good Recent;    Good Remote;   Fair  Judgement:  Fair  Insight:  Fair  Psychomotor Activity:  Normal  Concentration:  Concentration: Good and Attention Span: Good  Recall:  Good  Fund of Knowledge:  Good  Language:  Good  Akathisia:  No  Handed:  Right  AIMS (if indicated):   N/A  Assets:  Architect Housing Social Support  ADL's:  Intact  Cognition:  WNL  Sleep:   N/A     Demographic Factors:  Male and Caucasian  Loss Factors: Financial problems/change in socioeconomic status  Historical Factors: Impulsivity  Risk Reduction Factors:   Sense of responsibility to family  Continued Clinical Symptoms:  Depression:   Impulsivity  Cognitive Features That Contribute To Risk:  Closed-mindedness    Suicide Risk:  Minimal: No identifiable suicidal ideation.  Patients presenting with no risk factors but with morbid ruminations; may be classified as minimal risk based on the severity of the depressive symptoms    Plan Of Care/Follow-up recommendations:  Activity:  as tolerated Diet:  Heart Healthy  Disposition: Take all medications as prescribed as prescribed by your outpatient provider. Keep all follow-up appointments as scheduled at [facility] and [facility]  Do not consume alcohol or use illegal drugs while on prescription medications. Report any adverse effects from your medications to your primary care provider promptly.  In the event of recurrent symptoms or worsening symptoms, call 911, a crisis hotline, or go to the nearest emergency department for evaluation.   Laveda Abbe, NP 05/03/2018, 8:46 AM   Patient seen face-to-face for psychiatric evaluation, chart reviewed and case discussed with the physician extender and developed treatment plan. Reviewed the information documented and agree with the treatment plan.  Juanetta Beets, DO 05/03/18 10:52 PM

## 2018-05-03 NOTE — ED Notes (Signed)
AVS reviewed with pt. Pt given belongings. AOx4. Pt has called his own cab and is being taken to treatment facility. Pt walked out by Clinical research associate and witnessed getting in [facility] cab.

## 2018-05-03 NOTE — Discharge Instructions (Signed)
For your behavioral health needs, you are advised to follow up with the following providers:       [facility]      [address] [place], [place] [address]      [phone]      You have an appointment scheduled for 11:00 am today.       [facility]      [address] [place], [place] [address]      [phone]

## 2018-05-03 NOTE — Progress Notes (Signed)
CSW aware patient has been psychiatrically and medically cleared for discharge. CSW also aware patient has an appointment at the [facility] at 11am. CSW asked to provide patient with transportation resources to get to his appointment. CSW spoke with patient at bedside who reported that he didn't think he'd be able to make it to his appointment. CSW offered patient a bus pass and a map of the bus route to get to his appointment. Patient stated he could pay for a cab to get to his appointment. CSW provided patient with address to the [facility] which has also been included in his discharge paperwork. CSW has updated RN of patient disposition. No further CSW needs at this time, please reconsult if needs arise.  Archie Balboa, LCSWA  Clinical Social Work Department  [employer]  [phone]

## 2018-05-03 NOTE — BH Assessment (Signed)
[facility] Assessment Progress Note  Per Laveda Abbe, FNP, this pt does not require psychiatric hospitalization at this time.  Pt is to be discharged from [facility] with recommendation to follow up with [facility], where he has an appointment at 11:00 this morning, and with [facility].  This has been included in pt's discharge instructions.  Pt's nurse has been notified.  Doylene Canning, MA Triage Specialist [phone]

## 2018-05-18 ENCOUNTER — Encounter (HOSPITAL_COMMUNITY)
Admission: EM | Disposition: A | Discharge status: Home / Self Care | Payer: Self-pay | Source: Home / Self Care | Attending: Internal Medicine

## 2018-05-18 ENCOUNTER — Inpatient Hospital Stay (HOSPITAL_COMMUNITY)
Admission: EM | Admit: 2018-05-18 | Discharge: 2018-05-23 | DRG: 356 | Disposition: A | Discharge status: Home / Self Care | Payer: Medicare (Managed Care) | Attending: Family Medicine | Admitting: Family Medicine

## 2018-05-18 ENCOUNTER — Inpatient Hospital Stay (HOSPITAL_COMMUNITY): Payer: Medicare (Managed Care) | Admitting: Anesthesiology

## 2018-05-18 ENCOUNTER — Encounter (HOSPITAL_COMMUNITY): Payer: Self-pay

## 2018-05-18 ENCOUNTER — Inpatient Hospital Stay (HOSPITAL_COMMUNITY): Payer: Medicare (Managed Care)

## 2018-05-18 ENCOUNTER — Emergency Department (HOSPITAL_COMMUNITY): Payer: Medicare (Managed Care)

## 2018-05-18 DIAGNOSIS — R740 Nonspecific elevation of levels of transaminase and lactic acid dehydrogenase [LDH]: Secondary | ICD-10-CM | POA: Diagnosis present

## 2018-05-18 DIAGNOSIS — R7401 Elevation of levels of liver transaminase levels: Secondary | ICD-10-CM

## 2018-05-18 DIAGNOSIS — R578 Other shock: Secondary | ICD-10-CM | POA: Diagnosis present

## 2018-05-18 DIAGNOSIS — K264 Chronic or unspecified duodenal ulcer with hemorrhage: Secondary | ICD-10-CM | POA: Diagnosis present

## 2018-05-18 DIAGNOSIS — K208 Other esophagitis: Secondary | ICD-10-CM | POA: Diagnosis present

## 2018-05-18 DIAGNOSIS — R296 Repeated falls: Secondary | ICD-10-CM | POA: Diagnosis present

## 2018-05-18 DIAGNOSIS — R64 Cachexia: Secondary | ICD-10-CM | POA: Diagnosis present

## 2018-05-18 DIAGNOSIS — F17210 Nicotine dependence, cigarettes, uncomplicated: Secondary | ICD-10-CM | POA: Diagnosis present

## 2018-05-18 DIAGNOSIS — F314 Bipolar disorder, current episode depressed, severe, without psychotic features: Secondary | ICD-10-CM | POA: Diagnosis present

## 2018-05-18 DIAGNOSIS — T39395A Adverse effect of other nonsteroidal anti-inflammatory drugs [NSAID], initial encounter: Secondary | ICD-10-CM | POA: Diagnosis present

## 2018-05-18 DIAGNOSIS — E039 Hypothyroidism, unspecified: Secondary | ICD-10-CM | POA: Diagnosis present

## 2018-05-18 DIAGNOSIS — I4581 Long QT syndrome: Secondary | ICD-10-CM | POA: Diagnosis present

## 2018-05-18 DIAGNOSIS — K922 Gastrointestinal hemorrhage, unspecified: Secondary | ICD-10-CM

## 2018-05-18 DIAGNOSIS — I10 Essential (primary) hypertension: Secondary | ICD-10-CM | POA: Diagnosis present

## 2018-05-18 DIAGNOSIS — Z6820 Body mass index (BMI) 20.0-20.9, adult: Secondary | ICD-10-CM

## 2018-05-18 DIAGNOSIS — Z79890 Hormone replacement therapy (postmenopausal): Secondary | ICD-10-CM | POA: Diagnosis not present

## 2018-05-18 DIAGNOSIS — E876 Hypokalemia: Secondary | ICD-10-CM | POA: Diagnosis present

## 2018-05-18 DIAGNOSIS — K921 Melena: Secondary | ICD-10-CM | POA: Diagnosis not present

## 2018-05-18 DIAGNOSIS — D62 Acute posthemorrhagic anemia: Secondary | ICD-10-CM | POA: Diagnosis present

## 2018-05-18 DIAGNOSIS — E0590 Thyrotoxicosis, unspecified without thyrotoxic crisis or storm: Secondary | ICD-10-CM | POA: Diagnosis present

## 2018-05-18 DIAGNOSIS — F1010 Alcohol abuse, uncomplicated: Secondary | ICD-10-CM | POA: Diagnosis not present

## 2018-05-18 DIAGNOSIS — F1011 Alcohol abuse, in remission: Secondary | ICD-10-CM | POA: Diagnosis present

## 2018-05-18 HISTORY — PX: IR ANGIOGRAM VISCERAL SELECTIVE: IMG657

## 2018-05-18 HISTORY — PX: IR ANGIOGRAM SELECTIVE EACH ADDITIONAL VESSEL: IMG667

## 2018-05-18 HISTORY — PX: IR EMBO ART  VEN HEMORR LYMPH EXTRAV  INC GUIDE ROADMAPPING: IMG5450

## 2018-05-18 HISTORY — PX: ESOPHAGOGASTRODUODENOSCOPY (EGD) WITH PROPOFOL: SHX5813

## 2018-05-18 HISTORY — PX: IR US GUIDE VASC ACCESS RIGHT: IMG2390

## 2018-05-18 LAB — CBC WITH DIFFERENTIAL/PLATELET
Abs Immature Granulocytes: 0.07 10*3/uL (ref 0.00–0.07)
BASOS ABS: 0 10*3/uL (ref 0.0–0.1)
Basophils Relative: 0 %
EOS PCT: 1 %
Eosinophils Absolute: 0.1 10*3/uL (ref 0.0–0.5)
HCT: 23.8 % — ABNORMAL LOW (ref 39.0–52.0)
Hemoglobin: 7.3 g/dL — ABNORMAL LOW (ref 13.0–17.0)
IMMATURE GRANULOCYTES: 1 %
Lymphocytes Relative: 11 %
Lymphs Abs: 0.9 10*3/uL (ref 0.7–4.0)
MCH: 25.3 pg — ABNORMAL LOW (ref 26.0–34.0)
MCHC: 30.7 g/dL (ref 30.0–36.0)
MCV: 82.6 fL (ref 80.0–100.0)
Monocytes Absolute: 0.8 10*3/uL (ref 0.1–1.0)
Monocytes Relative: 9 %
NEUTROS PCT: 78 %
NRBC: 0 % (ref 0.0–0.2)
Neutro Abs: 6.4 10*3/uL (ref 1.7–7.7)
Platelets: 341 10*3/uL (ref 150–400)
RBC: 2.88 MIL/uL — AB (ref 4.22–5.81)
RDW: 19.7 % — AB (ref 11.5–15.5)
WBC: 8.2 10*3/uL (ref 4.0–10.5)

## 2018-05-18 LAB — HEMOGLOBIN AND HEMATOCRIT, BLOOD
HCT: 22.5 % — ABNORMAL LOW (ref 39.0–52.0)
HCT: 22.4 % — ABNORMAL LOW (ref 39.0–52.0)
Hemoglobin: 6.9 g/dL — CL (ref 13.0–17.0)
Hemoglobin: 7 g/dL — ABNORMAL LOW (ref 13.0–17.0)

## 2018-05-18 LAB — PROTIME-INR
INR: 0.97
PROTHROMBIN TIME: 12.8 s (ref 11.4–15.2)

## 2018-05-18 LAB — COMPREHENSIVE METABOLIC PANEL
ALT: 34 U/L (ref 0–44)
ANION GAP: 10 (ref 5–15)
AST: 47 U/L — ABNORMAL HIGH (ref 15–41)
Albumin: 3.4 g/dL — ABNORMAL LOW (ref 3.5–5.0)
Alkaline Phosphatase: 137 U/L — ABNORMAL HIGH (ref 38–126)
BUN: 18 mg/dL (ref 8–23)
CO2: 28 mmol/L (ref 22–32)
Calcium: 8.7 mg/dL — ABNORMAL LOW (ref 8.9–10.3)
Chloride: 104 mmol/L (ref 98–111)
Creatinine, Ser: 0.94 mg/dL (ref 0.61–1.24)
Glucose, Bld: 111 mg/dL — ABNORMAL HIGH (ref 70–99)
Potassium: 2.9 mmol/L — ABNORMAL LOW (ref 3.5–5.1)
SODIUM: 142 mmol/L (ref 135–145)
Total Bilirubin: 0.6 mg/dL (ref 0.3–1.2)
Total Protein: 6.5 g/dL (ref 6.5–8.1)

## 2018-05-18 LAB — MRSA PCR SCREENING: MRSA BY PCR: NEGATIVE

## 2018-05-18 LAB — I-STAT TROPONIN, ED: Troponin i, poc: 0.01 ng/mL (ref 0.00–0.08)

## 2018-05-18 LAB — POC OCCULT BLOOD, ED: Fecal Occult Bld: POSITIVE — AB

## 2018-05-18 LAB — PREPARE RBC (CROSSMATCH)

## 2018-05-18 LAB — MAGNESIUM: MAGNESIUM: 2.2 mg/dL (ref 1.7–2.4)

## 2018-05-18 LAB — ABO/RH: ABO/RH(D): A POS

## 2018-05-18 SURGERY — ESOPHAGOGASTRODUODENOSCOPY (EGD) WITH PROPOFOL
Anesthesia: Monitor Anesthesia Care

## 2018-05-18 MED ORDER — IOPAMIDOL (ISOVUE-300) INJECTION 61%
100.0000 mL | Freq: Once | INTRAVENOUS | Status: AC | PRN
  Administered 2018-05-18: 20 mL via INTRA_ARTERIAL

## 2018-05-18 MED ORDER — POTASSIUM CHLORIDE 10 MEQ/100ML IV SOLN
10.0000 meq | INTRAVENOUS | Status: AC
  Administered 2018-05-18 (×3): 10 meq via INTRAVENOUS
  Filled 2018-05-18 (×2): qty 100

## 2018-05-18 MED ORDER — PROPOFOL 500 MG/50ML IV EMUL
INTRAVENOUS | Status: DC | PRN
  Administered 2018-05-18: 150 ug/kg/min via INTRAVENOUS

## 2018-05-18 MED ORDER — LIDOCAINE HCL 1 % IJ SOLN
INTRAMUSCULAR | Status: AC
  Filled 2018-05-18: qty 20

## 2018-05-18 MED ORDER — FENTANYL CITRATE (PF) 100 MCG/2ML IJ SOLN
INTRAMUSCULAR | Status: AC
  Filled 2018-05-18: qty 2

## 2018-05-18 MED ORDER — LACTATED RINGERS IV SOLN
INTRAVENOUS | Status: DC | PRN
  Administered 2018-05-18: 14:00:00 via INTRAVENOUS

## 2018-05-18 MED ORDER — IOPAMIDOL (ISOVUE-300) INJECTION 61%
100.0000 mL | Freq: Once | INTRAVENOUS | Status: AC | PRN
  Administered 2018-05-18: 70 mL via INTRA_ARTERIAL

## 2018-05-18 MED ORDER — MIDAZOLAM HCL 2 MG/2ML IJ SOLN
INTRAMUSCULAR | Status: AC | PRN
  Administered 2018-05-18 (×3): 0.5 mg via INTRAVENOUS

## 2018-05-18 MED ORDER — MIDAZOLAM HCL 2 MG/2ML IJ SOLN
INTRAMUSCULAR | Status: AC
  Filled 2018-05-18: qty 2

## 2018-05-18 MED ORDER — FENTANYL CITRATE (PF) 100 MCG/2ML IJ SOLN
INTRAMUSCULAR | Status: AC | PRN
  Administered 2018-05-18 (×2): 25 ug via INTRAVENOUS

## 2018-05-18 MED ORDER — SODIUM CHLORIDE 0.9 % IV SOLN
8.0000 mg/h | INTRAVENOUS | Status: DC
  Administered 2018-05-18 – 2018-05-21 (×6): 8 mg/h via INTRAVENOUS
  Filled 2018-05-18 (×11): qty 80

## 2018-05-18 MED ORDER — NALOXONE HCL 0.4 MG/ML IJ SOLN
INTRAMUSCULAR | Status: AC
  Filled 2018-05-18: qty 1

## 2018-05-18 MED ORDER — NICOTINE 21 MG/24HR TD PT24
21.0000 mg | MEDICATED_PATCH | Freq: Every day | TRANSDERMAL | Status: DC
  Administered 2018-05-18 – 2018-05-22 (×5): 21 mg via TRANSDERMAL
  Filled 2018-05-18 (×6): qty 1

## 2018-05-18 MED ORDER — PANTOPRAZOLE SODIUM 40 MG IV SOLR
40.0000 mg | Freq: Once | INTRAVENOUS | Status: AC
  Administered 2018-05-18: 40 mg via INTRAVENOUS
  Filled 2018-05-18: qty 40

## 2018-05-18 MED ORDER — OCTREOTIDE LOAD VIA INFUSION
50.0000 ug | Freq: Once | INTRAVENOUS | Status: AC
  Administered 2018-05-18: 50 ug via INTRAVENOUS
  Filled 2018-05-18: qty 25

## 2018-05-18 MED ORDER — PROPOFOL 10 MG/ML IV BOLUS
INTRAVENOUS | Status: AC
  Filled 2018-05-18: qty 40

## 2018-05-18 MED ORDER — PROPOFOL 10 MG/ML IV BOLUS
INTRAVENOUS | Status: DC | PRN
  Administered 2018-05-18: 30 mg via INTRAVENOUS

## 2018-05-18 MED ORDER — FLUMAZENIL 0.5 MG/5ML IV SOLN
INTRAVENOUS | Status: AC
  Filled 2018-05-18: qty 5

## 2018-05-18 MED ORDER — SODIUM CHLORIDE 0.9 % IV BOLUS
1000.0000 mL | Freq: Once | INTRAVENOUS | Status: AC
  Administered 2018-05-18: 1000 mL via INTRAVENOUS

## 2018-05-18 MED ORDER — PANTOPRAZOLE SODIUM 40 MG IV SOLR: 40.0000 mg | Freq: Two times a day (BID) | INTRAVENOUS | Status: DC

## 2018-05-18 MED ORDER — SODIUM CHLORIDE 0.9 % IV SOLN
10.0000 mL/h | Freq: Once | INTRAVENOUS | Status: AC
  Administered 2018-05-18: 10 mL/h via INTRAVENOUS

## 2018-05-18 MED ORDER — POTASSIUM CHLORIDE 10 MEQ/100ML IV SOLN
10.0000 meq | INTRAVENOUS | Status: AC
  Administered 2018-05-18 (×3): 10 meq via INTRAVENOUS
  Filled 2018-05-18 (×4): qty 100

## 2018-05-18 MED ORDER — SODIUM CHLORIDE 0.9 % IV SOLN
25.0000 ug/h | INTRAVENOUS | Status: DC
  Administered 2018-05-18 – 2018-05-20 (×4): 25 ug/h via INTRAVENOUS
  Filled 2018-05-18 (×4): qty 1

## 2018-05-18 MED ORDER — ONDANSETRON HCL 4 MG/2ML IJ SOLN
INTRAMUSCULAR | Status: DC | PRN
  Administered 2018-05-18: 4 mg via INTRAVENOUS

## 2018-05-18 MED ORDER — POTASSIUM CHLORIDE 10 MEQ/100ML IV SOLN: 10.0000 meq | INTRAVENOUS | Status: DC

## 2018-05-18 SURGICAL SUPPLY — 15 items

## 2018-05-18 NOTE — ED Notes (Signed)
Patient requested to walk to bathroom to have bowel movement. Explained to patient he needs to use bed pain due to him being orthostatic and having near syncope upon standing with EMS. Patient had large black stool, diarrhea/ gooey substance like.

## 2018-05-18 NOTE — ED Provider Notes (Signed)
[facility]-EMERGENCY DEPT Provider Note   CSN: [id number] Arrival date & time: 05/18/18  0802     History   Chief Complaint Chief Complaint  Patient presents with  . Fatigue  . Hypotension    HPI Walter Nicholson is a 67 y.o. male.  He presents by EMS after feeling weak.  He said for the last few days he has had increased shortness of breath with exertion intermittent chest pain some upper abdominal pain and dark stools.  He has been walking to meetings to help him with his alcoholism and he finds himself having to stop during the walks because he so short of breath.  He has a history of peptic ulcers.  EMS found him orthostatic and near syncopal when standing.  Initial blood pressures were 90/50 lying down and 60/40 when standing.  The history is provided by the patient.  GI Problem  This is a recurrent problem. Episode onset: 5 days. The problem occurs constantly. The problem has not changed since onset.Associated symptoms include chest pain, abdominal pain and shortness of breath. Pertinent negatives include no headaches. The symptoms are aggravated by walking. The symptoms are relieved by rest. He has tried nothing for the symptoms. The treatment provided no relief.    Past Medical History:  Diagnosis Date  . Depression   . Hyperthyroidism     Patient Active Problem List   Diagnosis Date Noted  . Alcohol abuse   . Hypothyroidism 04/28/2018  . Bipolar I disorder, severe, current or most recent episode depressed, with melancholic features (HCC) 04/24/2018  . Major depressive disorder, recurrent, severe without psychotic features (HCC) 04/23/2018    History reviewed. No pertinent surgical history.      Home Medications    Prior to Admission medications   Medication Sig Start Date End Date Taking? Authorizing Provider  acetaminophen (TYLENOL) 500 MG tablet Take 500 mg by mouth every 6 (six) hours as needed for moderate pain.    [provider]  citalopram (CELEXA) 40 MG tablet Take 1 tablet (40 mg total) by mouth daily. For depression 05/01/18   Armandina Stammer I, NP  diclofenac (VOLTAREN) 75 MG EC tablet Take 75 mg by mouth 2 (two) times daily.    [provider]  gabapentin (NEURONTIN) 400 MG capsule Take 2 capsules (800 mg total) by mouth 3 (three) times daily. For agitation 05/01/18   Armandina Stammer I, NP  hydrOXYzine (ATARAX/VISTARIL) 50 MG tablet Take 1 tablet (50 mg total) by mouth every 6 (six) hours as needed for anxiety. 05/01/18   Armandina Stammer I, NP  lamoTRIgine (LAMICTAL) 200 MG tablet Take 1 tablet (200 mg total) by mouth 2 (two) times daily. For mood stabilization 05/01/18   Armandina Stammer I, NP  levothyroxine (SYNTHROID, LEVOTHROID) 75 MCG tablet Take 1 tablet (75 mcg total) by mouth daily before breakfast. For thyroid hormone replacement 05/02/18   Armandina Stammer I, NP  lisinopril (PRINIVIL,ZESTRIL) 20 MG tablet Take 1 tablet (20 mg total) by mouth daily. For high blood pressure 05/01/18   Armandina Stammer I, NP  mirtazapine (REMERON) 30 MG tablet Take 1 tablet (30 mg total) by mouth at bedtime. For depression 05/01/18   Armandina Stammer I, NP  naproxen sodium (ALEVE) 220 MG tablet Take 440 mg by mouth daily as needed (pain).    [provider]  nicotine (NICODERM CQ - DOSED IN MG/24 HOURS) 21 mg/24hr patch Place 1 patch (21 mg total) onto the skin daily. (May buy  from over the counter): For nicotine withdrawal Patient not taking: Reported on 05/02/2018 05/02/18   Armandina Stammer I, NP  pantoprazole (PROTONIX) 40 MG tablet Take 1 tablet (40 mg total) by mouth 2 (two) times daily. For acid reflux 05/01/18   Armandina Stammer I, NP  sucralfate (CARAFATE) 1 g tablet Take 1 tablet (1 g total) by mouth 4 (four) times daily -  with meals and at bedtime. For Ulcer 05/01/18   Armandina Stammer I, NP  traMADol (ULTRAM) 50 MG tablet Take 50 mg by mouth 3 (three) times daily as needed for moderate pain.    [provider]    Family  History History reviewed. No pertinent family history.  Social History Social History   Tobacco Use  . Smoking status: Current Every Day Smoker  . Smokeless tobacco: Never Used  Substance Use Topics  . Alcohol use: Yes  . Drug use: Not Currently     Allergies   Patient has no known allergies.   Review of Systems Review of Systems  Constitutional: Positive for fatigue. Negative for fever.  HENT: Negative for sore throat.   Eyes: Negative for visual disturbance.  Respiratory: Positive for shortness of breath.   Cardiovascular: Positive for chest pain.  Gastrointestinal: Positive for abdominal pain.  Genitourinary: Negative for dysuria.  Musculoskeletal: Negative for back pain.  Skin: Negative for rash.  Neurological: Positive for light-headedness. Negative for headaches.     Physical Exam Updated Vital Signs BP (!) 183/94   Pulse 77   Temp 98.2 F (36.8 C) (Oral)   Resp (!) 29   Ht 5\' 6"  (1.676 m)   Wt 56.7 kg   SpO2 100%   BMI 20.18 kg/m   Physical Exam  Constitutional: He is oriented to person, place, and time. He appears well-developed and well-nourished.  HENT:  Head: Normocephalic and atraumatic.  Eyes: Conjunctivae are normal.  Neck: Neck supple.  Cardiovascular: Regular rhythm and normal heart sounds. Tachycardia present.  No murmur heard. Pulmonary/Chest: Effort normal and breath sounds normal. No respiratory distress. He has no wheezes. He has no rales.  Abdominal: Soft. He exhibits no mass. There is tenderness (vague upper). There is no rebound and no guarding.  Genitourinary: Rectal exam shows guaiac positive stool.  Musculoskeletal: Normal range of motion. He exhibits no edema, tenderness or deformity.  Neurological: He is alert and oriented to person, place, and time. He has normal strength. GCS eye subscore is 4. GCS verbal subscore is 5. GCS motor subscore is 6.  Skin: Skin is warm and dry.  Psychiatric: He has a normal mood and affect.    Nursing note and vitals reviewed.    ED Treatments / Results  Labs (all labs ordered are listed, but only abnormal results are displayed) Labs Reviewed  COMPREHENSIVE METABOLIC PANEL - Abnormal; Notable for the following components:      Result Value   Potassium 2.9 (*)    Glucose, Bld 111 (*)    Calcium 8.7 (*)    Albumin 3.4 (*)    AST 47 (*)    Alkaline Phosphatase 137 (*)    All other components within normal limits  CBC WITH DIFFERENTIAL/PLATELET - Abnormal; Notable for the following components:   RBC 2.88 (*)    Hemoglobin 7.3 (*)    HCT 23.8 (*)    MCH 25.3 (*)    RDW 19.7 (*)    All other components within normal limits  HEMOGLOBIN AND HEMATOCRIT, BLOOD - Abnormal; Notable for the  following components:   Hemoglobin 6.9 (*)    HCT 22.5 (*)    All other components within normal limits  HEMOGLOBIN AND HEMATOCRIT, BLOOD - Abnormal; Notable for the following components:   Hemoglobin 7.0 (*)    HCT 22.4 (*)    All other components within normal limits  COMPREHENSIVE METABOLIC PANEL - Abnormal; Notable for the following components:   Glucose, Bld 110 (*)    Calcium 8.2 (*)    Total Protein 5.1 (*)    Albumin 2.6 (*)    All other components within normal limits  CBC - Abnormal; Notable for the following components:   RBC 2.67 (*)    Hemoglobin 7.4 (*)    HCT 23.2 (*)    RDW 17.1 (*)    All other components within normal limits  POC OCCULT BLOOD, ED - Abnormal; Notable for the following components:   Fecal Occult Bld POSITIVE (*)    All other components within normal limits  MRSA PCR SCREENING  PROTIME-INR  MAGNESIUM  HIV ANTIBODY (ROUTINE TESTING W REFLEX)  HEMOGLOBIN AND HEMATOCRIT, BLOOD  HEMOGLOBIN AND HEMATOCRIT, BLOOD  HEMOGLOBIN AND HEMATOCRIT, BLOOD  I-STAT TROPONIN, ED  TYPE AND SCREEN  PREPARE RBC (CROSSMATCH)  ABO/RH    EKG EKG Interpretation  Date/Time:  Friday May 18 2018 08:20:17 EDT Ventricular Rate:  97 PR Interval:    QRS  Duration: 82 QT Interval:  383 QTC Calculation: 487 R Axis:   83 Text Interpretation:  Sinus rhythm Borderline right axis deviation Consider left ventricular hypertrophy Borderline prolonged QT interval similar pattern to prior 9/19 Confirmed by Meridee Score [phone]) on 05/18/2018 8:27:38 AM   Radiology Ir Angiogram Visceral Selective  Result Date: 05/18/2018 EXAM: SELECTIVE VISCERAL ARTERIOGRAPHY; ADDITIONAL ARTERIOGRAPHY; IR EMBO ART VEN HEMORR LYMPH EXTRAV INC GUIDE ROADMAPPING; IR ULTRASOUND GUIDANCE VASC ACCESS RIGHT MEDICATIONS: None ANESTHESIA/SEDATION: Moderate (conscious) sedation was employed during this procedure. A total of Versed 1.5 mg and Fentanyl 50 mcg was administered intravenously. Moderate Sedation Time: 62 minutes. The patient's level of consciousness and vital signs were monitored continuously by radiology nursing throughout the procedure under my direct supervision. CONTRAST:  20mL ISOVUE-300 IOPAMIDOL (ISOVUE-300) INJECTION 61%, 70mL ISOVUE-300 IOPAMIDOL (ISOVUE-300) INJECTION 61% FLUOROSCOPY TIME:  Fluoroscopy Time: 20 minutes 0 seconds (1913 mGy). COMPLICATIONS: None immediate. PROCEDURE: Informed consent was obtained from the patient following explanation of the procedure, risks, benefits and alternatives. The patient understands, agrees and consents for the procedure. All questions were addressed. A time out was performed prior to the initiation of the procedure. Maximal barrier sterile technique utilized including caps, mask, sterile gowns, sterile gloves, large sterile drape, hand hygiene, and Betadine prep. The right common femoral artery was interrogated with ultrasound and found to be widely patent. An image was obtained and stored for the medical record. Local anesthesia was attained by infiltration with 1% lidocaine. A small dermatotomy was made. Under real-time sonographic guidance, the vessel was punctured with a 21 gauge micropuncture needle. Using standard  technique, the initial micro needle was exchanged over a 0.018 micro wire for a transitional 4 [place] micro sheath. The micro sheath was then exchanged over a 0.035 wire for a 5 French vascular sheath. A C2 cobra catheter was advanced over a Bentson wire into the abdominal aorta. Despite multiple attempts, the origin of the celiac artery could not be catheterized. The origin of the superior mesenteric artery was catheterized. Arteriography was performed. The inferior pancreaticoduodenal arcade is hypertrophic and provides robust flow retrograde up the gastroduodenal  arteries supplying the right a proper hepatic artery. 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 There is a small vessel stump in the region of the inferior genu of the gastroduodenal artery concerning for and injured/recently bleeding artery. A renegade ST microcatheter was advanced over a Fathom 16 wire into a branch off the superior mesenteric artery. Arteriography demonstrates anatomy most consistent with the middle colic artery. There is no communication with the gastroduodenal artery. The microcatheter was brought back into the main superior mesenteric artery. The catheter was then successfully navigated into the inferior pancreaticoduodenal artery. Arteriography was performed in multiple obliquities confirming the anatomy of the inferior pancreatic 0 duodenal artery, its anastomosis with the right gastroepiploic artery and right gastroduodenal artery. Again, there is a small beak of an artery consistent with an injured vessel. This appears to be in the expected location of the descending duodenum. The microcatheter was successfully navigated into the gastroduodenal artery. Contrast extravasation in the region of the abnormal branch artery demonstrates active extravasation. The microcatheter was successfully navigated beyond the injured artery into the gastroduodenal artery more proximal to the proper hepatic artery.  Coil embolization was then performed of the distal segment of the gastroduodenal artery across the origin of the injured arterial branch vessel using a series of 3, 4 and 5 mm fibered and non fibered interlock detachable microcoils. Care was taken to make sure the coil pack did not extend across the origin of the right gastroepiploic artery. Post embolization arteriography demonstrates complete cessation of flow within that segment of the gastroduodenal artery, no further extravasation from the injured artery and preservation of flow through the gastroepiploic artery. The microcatheter and 5 French catheters were removed. A limited right common femoral arteriogram was performed confirming common femoral arterial access. Of note, the patient has a high bifurcation. Hemostasis was attained with the assistance of a 5 [place] Cordis ExoSeal extra arterial vascular plug. IMPRESSION: 1. Evidence of arterial injury/active bleeding arising from a branch vessel from the distal gastroduodenal artery. 2. Successful coil embolization of the distal segment of the gastroduodenal artery including the origin of the injured arterial branch. Signed, Sterling Big, MD, RPVI [facility] Radiology Electronically Signed   By: Malachy Moan M.D.   On: 05/18/2018 17:17   Ir Angiogram Selective Each Additional Vessel  Result Date: 05/18/2018 EXAM: SELECTIVE VISCERAL ARTERIOGRAPHY; ADDITIONAL ARTERIOGRAPHY; IR EMBO ART VEN HEMORR LYMPH EXTRAV INC GUIDE ROADMAPPING; IR ULTRASOUND GUIDANCE VASC ACCESS RIGHT MEDICATIONS: None ANESTHESIA/SEDATION: Moderate (conscious) sedation was employed during this procedure. A total of Versed 1.5 mg and Fentanyl 50 mcg was administered intravenously. Moderate Sedation Time: 62 minutes. The patient's level of consciousness and vital signs were monitored continuously by radiology nursing throughout the procedure under my direct supervision.  CONTRAST:  20mL ISOVUE-300 IOPAMIDOL (ISOVUE-300) INJECTION 61%, 70mL ISOVUE-300 IOPAMIDOL (ISOVUE-300) INJECTION 61% FLUOROSCOPY TIME:  Fluoroscopy Time: 20 minutes 0 seconds (1913 mGy). COMPLICATIONS: None immediate. PROCEDURE: Informed consent was obtained from the patient following explanation of the procedure, risks, benefits and alternatives. The patient understands, agrees and consents for the procedure. All questions were addressed. A time out was performed prior to the initiation of the procedure. Maximal barrier sterile technique utilized including caps, mask, sterile gowns, sterile gloves, large sterile drape, hand hygiene, and Betadine prep. The right common femoral artery was interrogated with ultrasound and found to be widely patent. An image was obtained and stored for the medical record. Local anesthesia was attained  by infiltration with 1% lidocaine. A small dermatotomy was made. Under real-time sonographic guidance, the vessel was punctured with a 21 gauge micropuncture needle. Using standard technique, the initial micro needle was exchanged over a 0.018 micro wire for a transitional 4 [place] micro sheath. The micro sheath was then exchanged over a 0.035 wire for a 5 French vascular sheath. A C2 cobra catheter was advanced over a Bentson wire into the abdominal aorta. Despite multiple attempts, the origin of the celiac artery could not be catheterized. The origin of the superior mesenteric artery was catheterized. Arteriography was performed. The inferior pancreaticoduodenal arcade is hypertrophic and provides robust flow retrograde up the gastroduodenal arteries supplying the right a proper hepatic artery. 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 There is a small vessel stump in the region of the inferior genu of the gastroduodenal artery concerning for and injured/recently bleeding artery. A renegade ST microcatheter was advanced over a Fathom 16 wire into a branch  off the superior mesenteric artery. Arteriography demonstrates anatomy most consistent with the middle colic artery. There is no communication with the gastroduodenal artery. The microcatheter was brought back into the main superior mesenteric artery. The catheter was then successfully navigated into the inferior pancreaticoduodenal artery. Arteriography was performed in multiple obliquities confirming the anatomy of the inferior pancreatic 0 duodenal artery, its anastomosis with the right gastroepiploic artery and right gastroduodenal artery. Again, there is a small beak of an artery consistent with an injured vessel. This appears to be in the expected location of the descending duodenum. The microcatheter was successfully navigated into the gastroduodenal artery. Contrast extravasation in the region of the abnormal branch artery demonstrates active extravasation. The microcatheter was successfully navigated beyond the injured artery into the gastroduodenal artery more proximal to the proper hepatic artery. Coil embolization was then performed of the distal segment of the gastroduodenal artery across the origin of the injured arterial branch vessel using a series of 3, 4 and 5 mm fibered and non fibered interlock detachable microcoils. Care was taken to make sure the coil pack did not extend across the origin of the right gastroepiploic artery. Post embolization arteriography demonstrates complete cessation of flow within that segment of the gastroduodenal artery, no further extravasation from the injured artery and preservation of flow through the gastroepiploic artery. The microcatheter and 5 French catheters were removed. A limited right common femoral arteriogram was performed confirming common femoral arterial access. Of note, the patient has a high bifurcation. Hemostasis was attained with the assistance of a 5 [place] Cordis ExoSeal extra arterial vascular plug. IMPRESSION: 1. Evidence of arterial  injury/active bleeding arising from a branch vessel from the distal gastroduodenal artery. 2. Successful coil embolization of the distal segment of the gastroduodenal artery including the origin of the injured arterial branch. Signed, Sterling Big, MD, RPVI [facility] Radiology Electronically Signed   By: Malachy Moan M.D.   On: 05/18/2018 17:17   Ir Angiogram Selective Each Additional Vessel  Result Date: 05/18/2018 EXAM: SELECTIVE VISCERAL ARTERIOGRAPHY; ADDITIONAL ARTERIOGRAPHY; IR EMBO ART VEN HEMORR LYMPH EXTRAV INC GUIDE ROADMAPPING; IR ULTRASOUND GUIDANCE VASC ACCESS RIGHT MEDICATIONS: None ANESTHESIA/SEDATION: Moderate (conscious) sedation was employed during this procedure. A total of Versed 1.5 mg and Fentanyl 50 mcg was administered intravenously. Moderate Sedation Time: 62 minutes. The patient's level of consciousness and vital signs were monitored continuously by radiology nursing throughout the procedure under my direct supervision. CONTRAST:  20mL ISOVUE-300 IOPAMIDOL (  ISOVUE-300) INJECTION 61%, 70mL ISOVUE-300 IOPAMIDOL (ISOVUE-300) INJECTION 61% FLUOROSCOPY TIME:  Fluoroscopy Time: 20 minutes 0 seconds (1913 mGy). COMPLICATIONS: None immediate. PROCEDURE: Informed consent was obtained from the patient following explanation of the procedure, risks, benefits and alternatives. The patient understands, agrees and consents for the procedure. All questions were addressed. A time out was performed prior to the initiation of the procedure. Maximal barrier sterile technique utilized including caps, mask, sterile gowns, sterile gloves, large sterile drape, hand hygiene, and Betadine prep. The right common femoral artery was interrogated with ultrasound and found to be widely patent. An image was obtained and stored for the medical record. Local anesthesia was attained by infiltration with 1% lidocaine. A small dermatotomy was made. Under  real-time sonographic guidance, the vessel was punctured with a 21 gauge micropuncture needle. Using standard technique, the initial micro needle was exchanged over a 0.018 micro wire for a transitional 4 [place] micro sheath. The micro sheath was then exchanged over a 0.035 wire for a 5 French vascular sheath. A C2 cobra catheter was advanced over a Bentson wire into the abdominal aorta. Despite multiple attempts, the origin of the celiac artery could not be catheterized. The origin of the superior mesenteric artery was catheterized. Arteriography was performed. The inferior pancreaticoduodenal arcade is hypertrophic and provides robust flow retrograde up the gastroduodenal arteries supplying the right a proper hepatic artery. 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 There is a small vessel stump in the region of the inferior genu of the gastroduodenal artery concerning for and injured/recently bleeding artery. A renegade ST microcatheter was advanced over a Fathom 16 wire into a branch off the superior mesenteric artery. Arteriography demonstrates anatomy most consistent with the middle colic artery. There is no communication with the gastroduodenal artery. The microcatheter was brought back into the main superior mesenteric artery. The catheter was then successfully navigated into the inferior pancreaticoduodenal artery. Arteriography was performed in multiple obliquities confirming the anatomy of the inferior pancreatic 0 duodenal artery, its anastomosis with the right gastroepiploic artery and right gastroduodenal artery. Again, there is a small beak of an artery consistent with an injured vessel. This appears to be in the expected location of the descending duodenum. The microcatheter was successfully navigated into the gastroduodenal artery. Contrast extravasation in the region of the abnormal branch artery demonstrates active extravasation. The microcatheter was successfully  navigated beyond the injured artery into the gastroduodenal artery more proximal to the proper hepatic artery. Coil embolization was then performed of the distal segment of the gastroduodenal artery across the origin of the injured arterial branch vessel using a series of 3, 4 and 5 mm fibered and non fibered interlock detachable microcoils. Care was taken to make sure the coil pack did not extend across the origin of the right gastroepiploic artery. Post embolization arteriography demonstrates complete cessation of flow within that segment of the gastroduodenal artery, no further extravasation from the injured artery and preservation of flow through the gastroepiploic artery. The microcatheter and 5 French catheters were removed. A limited right common femoral arteriogram was performed confirming common femoral arterial access. Of note, the patient has a high bifurcation. Hemostasis was attained with the assistance of a 5 [place] Cordis ExoSeal extra arterial vascular plug. IMPRESSION: 1. Evidence of arterial injury/active bleeding arising from a branch vessel from the distal gastroduodenal artery. 2. Successful coil embolization of the distal segment of the gastroduodenal artery including the origin of the injured arterial branch. Signed, Vilma Prader  K. Archer Asa, MD, RPVI [facility] Radiology Electronically Signed   By: Malachy Moan M.D.   On: 05/18/2018 17:17   Ir Angiogram Selective Each Additional Vessel  Result Date: 05/18/2018 EXAM: SELECTIVE VISCERAL ARTERIOGRAPHY; ADDITIONAL ARTERIOGRAPHY; IR EMBO ART VEN HEMORR LYMPH EXTRAV INC GUIDE ROADMAPPING; IR ULTRASOUND GUIDANCE VASC ACCESS RIGHT MEDICATIONS: None ANESTHESIA/SEDATION: Moderate (conscious) sedation was employed during this procedure. A total of Versed 1.5 mg and Fentanyl 50 mcg was administered intravenously. Moderate Sedation Time: 62 minutes. The patient's level of consciousness and vital signs  were monitored continuously by radiology nursing throughout the procedure under my direct supervision. CONTRAST:  20mL ISOVUE-300 IOPAMIDOL (ISOVUE-300) INJECTION 61%, 70mL ISOVUE-300 IOPAMIDOL (ISOVUE-300) INJECTION 61% FLUOROSCOPY TIME:  Fluoroscopy Time: 20 minutes 0 seconds (1913 mGy). COMPLICATIONS: None immediate. PROCEDURE: Informed consent was obtained from the patient following explanation of the procedure, risks, benefits and alternatives. The patient understands, agrees and consents for the procedure. All questions were addressed. A time out was performed prior to the initiation of the procedure. Maximal barrier sterile technique utilized including caps, mask, sterile gowns, sterile gloves, large sterile drape, hand hygiene, and Betadine prep. The right common femoral artery was interrogated with ultrasound and found to be widely patent. An image was obtained and stored for the medical record. Local anesthesia was attained by infiltration with 1% lidocaine. A small dermatotomy was made. Under real-time sonographic guidance, the vessel was punctured with a 21 gauge micropuncture needle. Using standard technique, the initial micro needle was exchanged over a 0.018 micro wire for a transitional 4 [place] micro sheath. The micro sheath was then exchanged over a 0.035 wire for a 5 French vascular sheath. A C2 cobra catheter was advanced over a Bentson wire into the abdominal aorta. Despite multiple attempts, the origin of the celiac artery could not be catheterized. The origin of the superior mesenteric artery was catheterized. Arteriography was performed. The inferior pancreaticoduodenal arcade is hypertrophic and provides robust flow retrograde up the gastroduodenal arteries supplying the right a proper hepatic artery. 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 There is a small vessel stump in the region of the inferior genu of the gastroduodenal artery concerning for and  injured/recently bleeding artery. A renegade ST microcatheter was advanced over a Fathom 16 wire into a branch off the superior mesenteric artery. Arteriography demonstrates anatomy most consistent with the middle colic artery. There is no communication with the gastroduodenal artery. The microcatheter was brought back into the main superior mesenteric artery. The catheter was then successfully navigated into the inferior pancreaticoduodenal artery. Arteriography was performed in multiple obliquities confirming the anatomy of the inferior pancreatic 0 duodenal artery, its anastomosis with the right gastroepiploic artery and right gastroduodenal artery. Again, there is a small beak of an artery consistent with an injured vessel. This appears to be in the expected location of the descending duodenum. The microcatheter was successfully navigated into the gastroduodenal artery. Contrast extravasation in the region of the abnormal branch artery demonstrates active extravasation. The microcatheter was successfully navigated beyond the injured artery into the gastroduodenal artery more proximal to the proper hepatic artery. Coil embolization was then performed of the distal segment of the gastroduodenal artery across the origin of the injured arterial branch vessel using a series of 3, 4 and 5 mm fibered and non fibered interlock detachable microcoils. Care was taken to make sure the coil pack did not extend across the origin of the right gastroepiploic artery.  Post embolization arteriography demonstrates complete cessation of flow within that segment of the gastroduodenal artery, no further extravasation from the injured artery and preservation of flow through the gastroepiploic artery. The microcatheter and 5 French catheters were removed. A limited right common femoral arteriogram was performed confirming common femoral arterial access. Of note, the patient has a high bifurcation. Hemostasis was attained with the  assistance of a 5 [place] Cordis ExoSeal extra arterial vascular plug. IMPRESSION: 1. Evidence of arterial injury/active bleeding arising from a branch vessel from the distal gastroduodenal artery. 2. Successful coil embolization of the distal segment of the gastroduodenal artery including the origin of the injured arterial branch. Signed, Sterling Big, MD, RPVI [facility] Radiology Electronically Signed   By: Malachy Moan M.D.   On: 05/18/2018 17:17   Ir US Guide Vasc Access Right  Result Date: 05/18/2018 EXAM: SELECTIVE VISCERAL ARTERIOGRAPHY; ADDITIONAL ARTERIOGRAPHY; IR EMBO ART VEN HEMORR LYMPH EXTRAV INC GUIDE ROADMAPPING; IR ULTRASOUND GUIDANCE VASC ACCESS RIGHT MEDICATIONS: None ANESTHESIA/SEDATION: Moderate (conscious) sedation was employed during this procedure. A total of Versed 1.5 mg and Fentanyl 50 mcg was administered intravenously. Moderate Sedation Time: 62 minutes. The patient's level of consciousness and vital signs were monitored continuously by radiology nursing throughout the procedure under my direct supervision. CONTRAST:  20mL ISOVUE-300 IOPAMIDOL (ISOVUE-300) INJECTION 61%, 70mL ISOVUE-300 IOPAMIDOL (ISOVUE-300) INJECTION 61% FLUOROSCOPY TIME:  Fluoroscopy Time: 20 minutes 0 seconds (1913 mGy). COMPLICATIONS: None immediate. PROCEDURE: Informed consent was obtained from the patient following explanation of the procedure, risks, benefits and alternatives. The patient understands, agrees and consents for the procedure. All questions were addressed. A time out was performed prior to the initiation of the procedure. Maximal barrier sterile technique utilized including caps, mask, sterile gowns, sterile gloves, large sterile drape, hand hygiene, and Betadine prep. The right common femoral artery was interrogated with ultrasound and found to be widely patent. An image was obtained and stored for the medical record. Local anesthesia  was attained by infiltration with 1% lidocaine. A small dermatotomy was made. Under real-time sonographic guidance, the vessel was punctured with a 21 gauge micropuncture needle. Using standard technique, the initial micro needle was exchanged over a 0.018 micro wire for a transitional 4 [place] micro sheath. The micro sheath was then exchanged over a 0.035 wire for a 5 French vascular sheath. A C2 cobra catheter was advanced over a Bentson wire into the abdominal aorta. Despite multiple attempts, the origin of the celiac artery could not be catheterized. The origin of the superior mesenteric artery was catheterized. Arteriography was performed. The inferior pancreaticoduodenal arcade is hypertrophic and provides robust flow retrograde up the gastroduodenal arteries supplying the right a proper hepatic artery. 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 There is a small vessel stump in the region of the inferior genu of the gastroduodenal artery concerning for and injured/recently bleeding artery. A renegade ST microcatheter was advanced over a Fathom 16 wire into a branch off the superior mesenteric artery. Arteriography demonstrates anatomy most consistent with the middle colic artery. There is no communication with the gastroduodenal artery. The microcatheter was brought back into the main superior mesenteric artery. The catheter was then successfully navigated into the inferior pancreaticoduodenal artery. Arteriography was performed in multiple obliquities confirming the anatomy of the inferior pancreatic 0 duodenal artery, its anastomosis with the right gastroepiploic artery and right gastroduodenal artery. Again, there is a small beak of an artery consistent with an  injured vessel. This appears to be in the expected location of the descending duodenum. The microcatheter was successfully navigated into the gastroduodenal artery. Contrast extravasation in the region of the abnormal  branch artery demonstrates active extravasation. The microcatheter was successfully navigated beyond the injured artery into the gastroduodenal artery more proximal to the proper hepatic artery. Coil embolization was then performed of the distal segment of the gastroduodenal artery across the origin of the injured arterial branch vessel using a series of 3, 4 and 5 mm fibered and non fibered interlock detachable microcoils. Care was taken to make sure the coil pack did not extend across the origin of the right gastroepiploic artery. Post embolization arteriography demonstrates complete cessation of flow within that segment of the gastroduodenal artery, no further extravasation from the injured artery and preservation of flow through the gastroepiploic artery. The microcatheter and 5 French catheters were removed. A limited right common femoral arteriogram was performed confirming common femoral arterial access. Of note, the patient has a high bifurcation. Hemostasis was attained with the assistance of a 5 [place] Cordis ExoSeal extra arterial vascular plug. IMPRESSION: 1. Evidence of arterial injury/active bleeding arising from a branch vessel from the distal gastroduodenal artery. 2. Successful coil embolization of the distal segment of the gastroduodenal artery including the origin of the injured arterial branch. Signed, Sterling Big, MD, RPVI [facility] Radiology Electronically Signed   By: Malachy Moan M.D.   On: 05/18/2018 17:17   Dg Chest Port 1 View  Result Date: 05/18/2018 CLINICAL DATA:  Shortness of breath, weakness, stumbling, abdominal pain for 5 days, dark-colored stools, history of peptic ulcers, smoker EXAM: PORTABLE CHEST 1 VIEW COMPARISON:  Portable exam 0846 hours compared 04/24/2018 FINDINGS: Minimal enlargement of cardiac silhouette. Atherosclerotic calcification aorta. Mediastinal contours and pulmonary vascularity normal. Minimal  subsegmental atelectasis at LEFT costophrenic angle. Lungs otherwise clear. No infiltrate, pleural effusion or pneumothorax. Bones demineralized. IMPRESSION: Minimal LEFT basilar subsegmental atelectasis. Electronically Signed   By: Ulyses Southward M.D.   On: 05/18/2018 09:22   Ir Embo Art  Peter [facility] Hem[facility] [employer] Guide Roadmapping  Result Date: 05/18/2018 EXAM: SELECTIVE VISCERAL ARTERIOGRAPHY; ADDITIONAL ARTERIOGRAPHY; IR EMBO ART VEN HEMORR LYMPH EXTRAV INC GUIDE ROADMAPPING; IR ULTRASOUND GUIDANCE VASC ACCESS RIGHT MEDICATIONS: None ANESTHESIA/SEDATION: Moderate (conscious) sedation was employed during this procedure. A total of Versed 1.5 mg and Fentanyl 50 mcg was administered intravenously. Moderate Sedation Time: 62 minutes. The patient's level of consciousness and vital signs were monitored continuously by radiology nursing throughout the procedure under my direct supervision. CONTRAST:  20mL ISOVUE-300 IOPAMIDOL (ISOVUE-300) INJECTION 61%, 70mL ISOVUE-300 IOPAMIDOL (ISOVUE-300) INJECTION 61% FLUOROSCOPY TIME:  Fluoroscopy Time: 20 minutes 0 seconds (1913 mGy). COMPLICATIONS: None immediate. PROCEDURE: Informed consent was obtained from the patient following explanation of the procedure, risks, benefits and alternatives. The patient understands, agrees and consents for the procedure. All questions were addressed. A time out was performed prior to the initiation of the procedure. Maximal barrier sterile technique utilized including caps, mask, sterile gowns, sterile gloves, large sterile drape, hand hygiene, and Betadine prep. The right common femoral artery was interrogated with ultrasound and found to be widely patent. An image was obtained and stored for the medical record. Local anesthesia was attained by infiltration with 1% lidocaine. A small dermatotomy was made. Under real-time sonographic guidance, the vessel was punctured with a 21 gauge micropuncture needle. Using standard technique, the  initial micro needle was exchanged over a 0.018 micro wire  for a transitional 4 [place] micro sheath. The micro sheath was then exchanged over a 0.035 wire for a 5 French vascular sheath. A C2 cobra catheter was advanced over a Bentson wire into the abdominal aorta. Despite multiple attempts, the origin of the celiac artery could not be catheterized. The origin of the superior mesenteric artery was catheterized. Arteriography was performed. The inferior pancreaticoduodenal arcade is hypertrophic and provides robust flow retrograde up the gastroduodenal arteries supplying the right a proper hepatic artery. 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 There is a small vessel stump in the region of the inferior genu of the gastroduodenal artery concerning for and injured/recently bleeding artery. A renegade ST microcatheter was advanced over a Fathom 16 wire into a branch off the superior mesenteric artery. Arteriography demonstrates anatomy most consistent with the middle colic artery. There is no communication with the gastroduodenal artery. The microcatheter was brought back into the main superior mesenteric artery. The catheter was then successfully navigated into the inferior pancreaticoduodenal artery. Arteriography was performed in multiple obliquities confirming the anatomy of the inferior pancreatic 0 duodenal artery, its anastomosis with the right gastroepiploic artery and right gastroduodenal artery. Again, there is a small beak of an artery consistent with an injured vessel. This appears to be in the expected location of the descending duodenum. The microcatheter was successfully navigated into the gastroduodenal artery. Contrast extravasation in the region of the abnormal branch artery demonstrates active extravasation. The microcatheter was successfully navigated beyond the injured artery into the gastroduodenal artery more proximal to the proper hepatic artery. Coil embolization  was then performed of the distal segment of the gastroduodenal artery across the origin of the injured arterial branch vessel using a series of 3, 4 and 5 mm fibered and non fibered interlock detachable microcoils. Care was taken to make sure the coil pack did not extend across the origin of the right gastroepiploic artery. Post embolization arteriography demonstrates complete cessation of flow within that segment of the gastroduodenal artery, no further extravasation from the injured artery and preservation of flow through the gastroepiploic artery. The microcatheter and 5 French catheters were removed. A limited right common femoral arteriogram was performed confirming common femoral arterial access. Of note, the patient has a high bifurcation. Hemostasis was attained with the assistance of a 5 [place] Cordis ExoSeal extra arterial vascular plug. IMPRESSION: 1. Evidence of arterial injury/active bleeding arising from a branch vessel from the distal gastroduodenal artery. 2. Successful coil embolization of the distal segment of the gastroduodenal artery including the origin of the injured arterial branch. Signed, Sterling Big, MD, RPVI [facility] Radiology Electronically Signed   By: Malachy Moan M.D.   On: 05/18/2018 17:17    Procedures .Critical Care Performed by: Terrilee Files, MD Authorized by: Terrilee Files, MD   Critical care provider statement:    Critical care time (minutes):  45   Critical care time was exclusive of:  Separately billable procedures and treating other patients   Critical care was necessary to treat or prevent imminent or life-threatening deterioration of the following conditions:  Circulatory failure   Critical care was time spent personally by me on the following activities:  Discussions with consultants, evaluation of patient's response to treatment, examination of patient, ordering and performing treatments  and interventions, ordering and review of laboratory studies, ordering and review of radiographic studies, pulse oximetry, re-evaluation of patient's condition, obtaining history from patient or surrogate,  review of old charts and development of treatment plan with patient or surrogate   I assumed direction of critical care for this patient from another provider in my specialty: no     (including critical care time)  Medications Ordered in ED Medications  pantoprazole (PROTONIX) injection 40 mg (has no administration in time range)  sodium chloride 0.9 % bolus 1,000 mL (has no administration in time range)     Initial Impression / Assessment and Plan / ED Course  I have reviewed the triage vital signs and the nursing notes.  Pertinent labs & imaging results that were available during my care of the patient were reviewed by me and considered in my medical decision making (see chart for details).  Clinical Course as of May 19 1054  Fri May 18, 2018  1610 Report from nurse the patient passed loose sticky black stool.  Patient states this is been going on for about 5 days and he has a history of peptic ulcer disease.  Protonix and IV fluids infusing.   [MB]  0906 Since hemoglobin is low at 7.3.  Few weeks ago he was up at 10-11.  INR is normal.   [MB]  [id number] Discussed with Dr. Marca Ancona from [facility].  She is recommending that we start him on octreotide loading drip. Will consult on him.  Paged hospitalist for admission.   [MB]  1011 Discussed with Dr. Lowell Guitar from the hospitalist service will admit the patient.  Patient updated on plan and in agreement.   [MB]    Clinical Course User Index [MB] Terrilee Files, MD      Final Clinical Impressions(s) / ED Diagnoses   Final diagnoses:  GI bleed    ED Discharge Orders    None       Terrilee Files, MD 05/19/18 1055

## 2018-05-18 NOTE — ED Notes (Signed)
Gastroenterologist at bedside.

## 2018-05-18 NOTE — Consult Note (Addendum)
Chief Complaint: Patient was seen in consultation today for mesenteric/visceral arteriogram with embolization Chief Complaint  Patient presents with  . Fatigue  . Hypotension     Referring Physician(s): Burnard Bunting  Supervising Physician: Malachy Moan  Patient Status: [facility] - In-pt  History of Present Illness: Walter Nicholson is a 67 y.o. male smoker with history of depression, bipolar disorder, thyroid disease, hypertension, alcohol abuse who presented to [facility] today with fatigue, lightheadedness , black stools, dyspnea with exertion, nausea/epigastric discomfort.  He has a prior history of NSAID use.  Hemoglobin 7.3.  Endoscopy performed this afternoon revealed esophagitis with coffee-ground fluid in the gastric cavity, mild antral erythema, normal cardia and fundus as well as large cratered duodenal ulcer with a large adherent clot which could not be displaced on lavage.  Request now received for visceral/mesenteric arteriogram with prophylactic embolization of GDA.  Past Medical History:  Diagnosis Date  . Depression   . Hyperthyroidism     History reviewed. No pertinent surgical history.  Allergies: Patient has no known allergies.  Medications: Prior to Admission medications   Medication Sig Start Date End Date Taking? Authorizing Provider  acetaminophen (TYLENOL) 500 MG tablet Take 500 mg by mouth every 6 (six) hours as needed for moderate pain.    [provider]  citalopram (CELEXA) 40 MG tablet Take 1 tablet (40 mg total) by mouth daily. For depression 05/01/18   Armandina Stammer I, NP  diclofenac (VOLTAREN) 75 MG EC tablet Take 75 mg by mouth 2 (two) times daily.    [provider]  gabapentin (NEURONTIN) 400 MG capsule Take 2 capsules (800 mg total) by mouth 3 (three) times daily. For agitation 05/01/18   Armandina Stammer I, NP  hydrOXYzine (ATARAX/VISTARIL) 50 MG tablet Take 1 tablet (50 mg total) by mouth every 6 (six) hours as needed  for anxiety. 05/01/18   Armandina Stammer I, NP  lamoTRIgine (LAMICTAL) 200 MG tablet Take 1 tablet (200 mg total) by mouth 2 (two) times daily. For mood stabilization 05/01/18   Armandina Stammer I, NP  levothyroxine (SYNTHROID, LEVOTHROID) 75 MCG tablet Take 1 tablet (75 mcg total) by mouth daily before breakfast. For thyroid hormone replacement 05/02/18   Armandina Stammer I, NP  lisinopril (PRINIVIL,ZESTRIL) 20 MG tablet Take 1 tablet (20 mg total) by mouth daily. For high blood pressure 05/01/18   Armandina Stammer I, NP  mirtazapine (REMERON) 30 MG tablet Take 1 tablet (30 mg total) by mouth at bedtime. For depression 05/01/18   Armandina Stammer I, NP  naproxen sodium (ALEVE) 220 MG tablet Take 440 mg by mouth daily as needed (pain).    [provider]  nicotine (NICODERM CQ - DOSED IN MG/24 HOURS) 21 mg/24hr patch Place 1 patch (21 mg total) onto the skin daily. (May buy from over the counter): For nicotine withdrawal Patient not taking: Reported on 05/02/2018 05/02/18   Armandina Stammer I, NP  pantoprazole (PROTONIX) 40 MG tablet Take 1 tablet (40 mg total) by mouth 2 (two) times daily. For acid reflux 05/01/18   Armandina Stammer I, NP  sucralfate (CARAFATE) 1 g tablet Take 1 tablet (1 g total) by mouth 4 (four) times daily -  with meals and at bedtime. For Ulcer 05/01/18   Armandina Stammer I, NP  traMADol (ULTRAM) 50 MG tablet Take 50 mg by mouth 3 (three) times daily as needed for moderate pain.    [provider]     History reviewed. No pertinent family history.  Social History   Socioeconomic History  . Marital status: Single    Spouse name: Not on file  . Number of children: Not on file  . Years of education: Not on file  . Highest education level: Not on file  Occupational History  . Not on file  Social Needs  . Financial resource strain: Not on file  . Food insecurity:    Worry: Not on file    Inability: Not on file  . Transportation needs:    Medical: Not on file    Non-medical: Not on file    Tobacco Use  . Smoking status: Current Every Day Smoker  . Smokeless tobacco: Never Used  Substance and Sexual Activity  . Alcohol use: Yes  . Drug use: Not Currently  . Sexual activity: Not Currently  Lifestyle  . Physical activity:    Days per week: Not on file    Minutes per session: Not on file  . Stress: Not on file  Relationships  . Social connections:    Talks on phone: Not on file    Gets together: Not on file    Attends religious service: Not on file    Active member of club or organization: Not on file    Attends meetings of clubs or organizations: Not on file    Relationship status: Not on file  Other Topics Concern  . Not on file  Social History Narrative  . Not on file     Review of Systems see above; denies fever, headache, back pain  Vital Signs: BP (!) 109/42   Pulse 73   Temp 98.4 F (36.9 C) (Oral)   Resp 12   Ht 5\' 6"  (1.676 m)   Wt 125 lb (56.7 kg)   SpO2 100%   BMI 20.18 kg/m   Physical Exam slightly drowsy but arousable.  Answers questions appropriately.  Chest clear to auscultation bilaterally.  Heart with regular rate and rhythm.  Abdomen soft, positive bowel sounds, tender epigastric region.  No lower extremity edema.  Imaging: Dg Chest 2 View  Result Date: 04/24/2018 CLINICAL DATA:  Shortness of breath with exertion EXAM: CHEST - 2 VIEW COMPARISON:  None. FINDINGS: Heart is normal size. Mediastinal contours are within normal limits. Increased density is noted anteriorly on the lateral view in the retrosternal region. No visible confluent opacity on the frontal view. No effusions. Old rib fractures in the right lateral 10th and 11th ribs. IMPRESSION: Increased density noted anteriorly and inferiorly in the retrosternal space on the lateral view. This could be further evaluated with chest CT with IV contrast. Electronically Signed   By: Charlett Nose M.D.   On: 04/24/2018 10:35   Dg Chest Port 1 View  Result Date: 05/18/2018 CLINICAL DATA:   Shortness of breath, weakness, stumbling, abdominal pain for 5 days, dark-colored stools, history of peptic ulcers, smoker EXAM: PORTABLE CHEST 1 VIEW COMPARISON:  Portable exam 0846 hours compared 04/24/2018 FINDINGS: Minimal enlargement of cardiac silhouette. Atherosclerotic calcification aorta. Mediastinal contours and pulmonary vascularity normal. Minimal subsegmental atelectasis at LEFT costophrenic angle. Lungs otherwise clear. No infiltrate, pleural effusion or pneumothorax. Bones demineralized. IMPRESSION: Minimal LEFT basilar subsegmental atelectasis. Electronically Signed   By: Ulyses Southward M.D.   On: 05/18/2018 09:22   US Thyroid  Result Date: 04/29/2018 CLINICAL DATA:  Hyperthyroid.  Low TSH. EXAM: THYROID ULTRASOUND TECHNIQUE: Ultrasound examination of the thyroid gland and adjacent soft tissues was performed. COMPARISON:  None. FINDINGS: Parenchymal Echotexture: Markedly heterogenous Isthmus: 0.3 cm  Right lobe: 4.0 x 1.7 x 1.3 cm Left lobe: 3.2 x 1.4 x 1.1 cm _________________________________________________________ Estimated total number of nodules >/= 1 cm: 0 Number of spongiform nodules >/=  2 cm not described below (TR1): 0 Number of mixed cystic and solid nodules >/= 1.5 cm not described below (TR2): 0 _________________________________________________________ The thyroid gland is diffusely heterogeneous. No focal nodules identified. No abnormal lymph nodes seen by ultrasound. IMPRESSION: Diffusely heterogeneous thyroid gland without evidence of focal nodules. The above is in keeping with the ACR TI-RADS recommendations - J Am Coll Radiol 2017;14:587-595. Electronically Signed   By: Irish Lack M.D.   On: 04/29/2018 14:29    Labs:  CBC: Recent Labs    04/22/18 0914 04/29/18 1828 05/02/18 1822 05/18/18 0827  WBC 2.2* 6.5 7.7 8.2  HGB 11.8* 10.3* 10.7* 7.3*  HCT 36.4* 33.0* 33.4* 23.8*  PLT 261 348 423* 341    COAGS: Recent Labs    05/18/18 0827  INR 0.97     BMP: Recent Labs    04/22/18 0914 05/02/18 1822 05/18/18 0827  NA 135 142 142  K 3.8 3.4* 2.9*  CL 98 104 104  CO2 25 27 28   GLUCOSE 103* 97 111*  BUN 8 15 18   CALCIUM 9.4 9.7 8.7*  CREATININE 0.81 0.85 0.94  GFRNONAA >60 >60 >60  GFRAA >60 >60 >60    LIVER FUNCTION TESTS: Recent Labs    04/22/18 0914 05/02/18 1822 05/18/18 0827  BILITOT 0.5 0.5 0.6  AST 20 16 47*  ALT 12 12 34  ALKPHOS 73 70 137*  PROT 7.6 7.5 6.5  ALBUMIN 4.4 4.2 3.4*    TUMOR MARKERS: No results for input(s): AFPTM, CEA, CA199, CHROMGRNA in the last 8760 hours.  Assessment and Plan: 67 y.o. male smoker with history of depression, bipolar disorder, thyroid disease, hypertension, alcohol abuse who presented to [facility] today with fatigue, lightheadedness , black stools, dyspnea with exertion, nausea/epigastric discomfort.  He has a prior history of NSAID use.  Hemoglobin 7.3. Creat 0.94.  Endoscopy performed this afternoon revealed esophagitis with coffee-ground fluid in the gastric cavity, mild antral erythema, normal cardia and fundus as well as large cratered duodenal ulcer with a large adherent clot which could not be displaced on lavage.  He is at high risk for rebleed.  Request now received for visceral/mesenteric arteriogram with prophylactic embolization of GDA.  Case has been discussed with Drs. Leroy Kennedy.  Patient has no family members available for consent therefore emergent consent for angiography/embolization given by Dr. Marca Ancona.  Although patient received recent IV conscious sedation procedure also discussed with him as well.   Thank you for this interesting consult.  I greatly enjoyed meeting Artem Bunte Schwake and look forward to participating in their care.  A copy of this report was sent to the requesting provider on this date.  Electronically Signed: D. Jeananne Rama, PA-C 05/18/2018, 2:35 PM   I spent a total of 30 minutes    in face to face in clinical  consultation, greater than 50% of which was counseling/coordinating care for visceral/mesenteric arteriogram with embolization

## 2018-05-18 NOTE — Progress Notes (Signed)
CRITICAL VALUE ALERT  Critical Value:  HGB - 6.9  Date & Time Notied:  05/18/18 1807  Provider Notified: Lowell Guitar  Orders Received/Actions taken: no new orders received at this time.

## 2018-05-18 NOTE — H&P (View-Only) (Signed)
[facility] Gastroenterology Consult  Referring Provider: Dr.Butler/ER Primary Care Physician:  Patient, No Pcp Per Primary Gastroenterologist: Gentry Fitz  Reason for Consultation: black stools  HPI: Walter Nicholson is a 67 y.o. male was brought to the ER by EMS(called by his room mate) for weakness, frequent falls, dizziness and black liquid stools for at least 5 days. He was found to be hypotensive, 90/50 on laying and 60/40 on standing and was noted to have 2 large black stools in the ER. He c/o pain in RUQ for the past few days.  Patient states he has history of alcohol abuse, but has not had alcohol for over 9 months, lives in a sober house with a friend.Denies history of cirrhosis but was told he has an enlarged liver.  He does take Advil as needed and  takes diclofenac on a regular basis. He reports having an EGD performed in [place], [place] 4 months ago and was told he had PUD and was give PPI and sucralfate. He is originally from [place] and seems a little elusive when asked details of his symptoms and prior medical conditions.  He reports having a colonoscopy in [place] over 10 years ago.  He has been losing about 20 lbs in the last few months and reports difficult as well as pain on swallowing.  He smokes at least 1 PPD of cigarettes, used cocaine in the past, last use reported at least 2 years ago, denies IVDA.    Past Medical History:  Diagnosis Date  . Depression   . Hyperthyroidism     History reviewed. No pertinent surgical history.  Prior to Admission medications   Medication Sig Start Date End Date Taking? Authorizing Provider  acetaminophen (TYLENOL) 500 MG tablet Take 500 mg by mouth every 6 (six) hours as needed for moderate pain.    [provider]  citalopram (CELEXA) 40 MG tablet Take 1 tablet (40 mg total) by mouth daily. For depression 05/01/18   Armandina Stammer I, NP  diclofenac (VOLTAREN) 75 MG EC tablet Take 75 mg by mouth 2 (two) times daily.    [provider]  gabapentin (NEURONTIN) 400 MG capsule Take 2 capsules (800 mg total) by mouth 3 (three) times daily. For agitation 05/01/18   Armandina Stammer I, NP  hydrOXYzine (ATARAX/VISTARIL) 50 MG tablet Take 1 tablet (50 mg total) by mouth every 6 (six) hours as needed for anxiety. 05/01/18   Armandina Stammer I, NP  lamoTRIgine (LAMICTAL) 200 MG tablet Take 1 tablet (200 mg total) by mouth 2 (two) times daily. For mood stabilization 05/01/18   Armandina Stammer I, NP  levothyroxine (SYNTHROID, LEVOTHROID) 75 MCG tablet Take 1 tablet (75 mcg total) by mouth daily before breakfast. For thyroid hormone replacement 05/02/18   Armandina Stammer I, NP  lisinopril (PRINIVIL,ZESTRIL) 20 MG tablet Take 1 tablet (20 mg total) by mouth daily. For high blood pressure 05/01/18   Armandina Stammer I, NP  mirtazapine (REMERON) 30 MG tablet Take 1 tablet (30 mg total) by mouth at bedtime. For depression 05/01/18   Armandina Stammer I, NP  naproxen sodium (ALEVE) 220 MG tablet Take 440 mg by mouth daily as needed (pain).    [provider]  nicotine (NICODERM CQ - DOSED IN MG/24 HOURS) 21 mg/24hr patch Place 1 patch (21 mg total) onto the skin daily. (May buy from over the counter): For nicotine withdrawal Patient not taking: Reported on 05/02/2018 05/02/18   Armandina Stammer I, NP  pantoprazole (PROTONIX) 40 MG tablet Take  1 tablet (40 mg total) by mouth 2 (two) times daily. For acid reflux 05/01/18   Armandina Stammer I, NP  sucralfate (CARAFATE) 1 g tablet Take 1 tablet (1 g total) by mouth 4 (four) times daily -  with meals and at bedtime. For Ulcer 05/01/18   Armandina Stammer I, NP  traMADol (ULTRAM) 50 MG tablet Take 50 mg by mouth 3 (three) times daily as needed for moderate pain.    [provider]    Current Facility-Administered Medications  Medication Dose Route Frequency Provider Last Rate Last Dose  . octreotide (SANDOSTATIN) 500 mcg in sodium chloride 0.9 % 250 mL (2 mcg/mL) infusion  25 mcg/hr Intravenous Continuous Terrilee Files, MD 12.5 mL/hr at 05/18/18 1032 25 mcg/hr at 05/18/18 1032  . pantoprazole (PROTONIX) 80 mg in sodium chloride 0.9 % 250 mL (0.32 mg/mL) infusion  8 mg/hr Intravenous Continuous Zigmund Daniel., MD      . Melene Muller ON 05/21/2018] pantoprazole (PROTONIX) injection 40 mg  40 mg Intravenous Q12H Zigmund Daniel., MD      . pantoprazole (PROTONIX) injection 40 mg  40 mg Intravenous Once Zigmund Daniel., MD      . potassium chloride 10 mEq in 100 mL IVPB  10 mEq Intravenous Q1 Hr x 3 Terrilee Files, MD 100 mL/hr at 05/18/18 1041 10 mEq at 05/18/18 1041  . potassium chloride 10 mEq in 100 mL IVPB  10 mEq Intravenous Q1 Hr x 3 Zigmund Daniel., MD       Current Outpatient Medications  Medication Sig Dispense Refill  . acetaminophen (TYLENOL) 500 MG tablet Take 500 mg by mouth every 6 (six) hours as needed for moderate pain.    . citalopram (CELEXA) 40 MG tablet Take 1 tablet (40 mg total) by mouth daily. For depression 30 tablet 0  . diclofenac (VOLTAREN) 75 MG EC tablet Take 75 mg by mouth 2 (two) times daily.    Marland Kitchen gabapentin (NEURONTIN) 400 MG capsule Take 2 capsules (800 mg total) by mouth 3 (three) times daily. For agitation 180 capsule 0  . hydrOXYzine (ATARAX/VISTARIL) 50 MG tablet Take 1 tablet (50 mg total) by mouth every 6 (six) hours as needed for anxiety. 60 tablet 0  . lamoTRIgine (LAMICTAL) 200 MG tablet Take 1 tablet (200 mg total) by mouth 2 (two) times daily. For mood stabilization 60 tablet 0  . levothyroxine (SYNTHROID, LEVOTHROID) 75 MCG tablet Take 1 tablet (75 mcg total) by mouth daily before breakfast. For thyroid hormone replacement 7 tablet 0  . lisinopril (PRINIVIL,ZESTRIL) 20 MG tablet Take 1 tablet (20 mg total) by mouth daily. For high blood pressure 15 tablet 0  . mirtazapine (REMERON) 30 MG tablet Take 1 tablet (30 mg total) by mouth at bedtime. For depression 30 tablet 0  . naproxen sodium (ALEVE) 220 MG tablet Take 440 mg by mouth daily  as needed (pain).    . nicotine (NICODERM CQ - DOSED IN MG/24 HOURS) 21 mg/24hr patch Place 1 patch (21 mg total) onto the skin daily. (May buy from over the counter): For nicotine withdrawal (Patient not taking: Reported on 05/02/2018) 28 patch 0  . pantoprazole (PROTONIX) 40 MG tablet Take 1 tablet (40 mg total) by mouth 2 (two) times daily. For acid reflux 15 tablet 0  . sucralfate (CARAFATE) 1 g tablet Take 1 tablet (1 g total) by mouth 4 (four) times daily -  with meals and at bedtime. For Ulcer  60 tablet 0  . traMADol (ULTRAM) 50 MG tablet Take 50 mg by mouth 3 (three) times daily as needed for moderate pain.      Allergies as of 05/18/2018  . (No Known Allergies)    History reviewed. No pertinent family history.  Social History   Socioeconomic History  . Marital status: Single    Spouse name: Not on file  . Number of children: Not on file  . Years of education: Not on file  . Highest education level: Not on file  Occupational History  . Not on file  Social Needs  . Financial resource strain: Not on file  . Food insecurity:    Worry: Not on file    Inability: Not on file  . Transportation needs:    Medical: Not on file    Non-medical: Not on file  Tobacco Use  . Smoking status: Current Every Day Smoker  . Smokeless tobacco: Never Used  Substance and Sexual Activity  . Alcohol use: Yes  . Drug use: Not Currently  . Sexual activity: Not Currently  Lifestyle  . Physical activity:    Days per week: Not on file    Minutes per session: Not on file  . Stress: Not on file  Relationships  . Social connections:    Talks on phone: Not on file    Gets together: Not on file    Attends religious service: Not on file    Active member of club or organization: Not on file    Attends meetings of clubs or organizations: Not on file    Relationship status: Not on file  . Intimate partner violence:    Fear of current or ex partner: Not on file    Emotionally abused: Not on file     Physically abused: Not on file    Forced sexual activity: Not on file  Other Topics Concern  . Not on file  Social History Narrative  . Not on file    Review of Systems: Positive for: GI: Described in detail in HPI.    Gen:  anorexia, fatigue, weakness, malaise, involuntary weight loss, Denies any fever, chills, rigors, night sweats,and sleep disorder CV: Denies chest pain, angina, palpitations, syncope, orthopnea, PND, peripheral edema, and claudication. Resp: Denies dyspnea, cough, sputum, wheezing, coughing up blood. GU : Denies urinary burning, blood in urine, urinary frequency, urinary hesitancy, nocturnal urination, and urinary incontinence. MS: Denies joint pain or swelling.  Denies muscle weakness, cramps, atrophy.  Derm: Denies rash, itching, oral ulcerations, hives, unhealing ulcers.  Psych: Denies depression, anxiety, memory loss, suicidal ideation, hallucinations,  and confusion. Heme: Black stools, Denies bruising and enlarged lymph nodes. Neuro: dizziness,  Denies any headaches, paresthesias. Endo:  Denies any problems with DM, thyroid, adrenal function.  Physical Exam: Vital signs in last 24 hours: Temp:  [98.6 F (37 C)-98.8 F (37.1 C)] 98.8 F (37.1 C) (10/11 1002) Pulse Rate:  [75-97] 83 (10/11 1030) Resp:  [14-15] 15 (10/11 1030) BP: (91-126)/(51-61) 115/61 (10/11 1030) SpO2:  [89 %-100 %] 94 % (10/11 1030)    General:   Alert,  Well-developed, well-nourished, pleasant and cooperative in NAD Head:  Normocephalic and atraumatic. Eyes:  Sclera clear, no icterus,  pallor. Ears:  Normal auditory acuity. Nose:  No deformity, discharge,  or lesions. Mouth:  No deformity or lesions.  Oropharynx pink & moist. Neck:  Supple; no masses or thyromegaly. Lungs:  Clear throughout to auscultation.   No wheezes, crackles, or rhonchi. No acute  distress. Heart:  Regular rate and rhythm; no murmurs, clicks, rubs,  or gallops. Extremities:  Without clubbing or  edema. Neurologic:  Alert and  oriented x4;  grossly normal neurologically. Skin:  Intact without significant lesions or rashes. Psych:  Alert and cooperative. Normal mood and affect. Abdomen:  Soft, mild ruq tenderness and nondistended. No masses, hepatosplenomegaly or hernias noted. Normal bowel sounds, without guarding, and without rebound.         Lab Results: Recent Labs    05/18/18 0827  WBC 8.2  HGB 7.3*  HCT 23.8*  PLT 341   BMET Recent Labs    05/18/18 0827  NA 142  K 2.9*  CL 104  CO2 28  GLUCOSE 111*  BUN 18  CREATININE 0.94  CALCIUM 8.7*   LFT Recent Labs    05/18/18 0827  PROT 6.5  ALBUMIN 3.4*  AST 47*  ALT 34  ALKPHOS 137*  BILITOT 0.6   PT/INR Recent Labs    05/18/18 0827  LABPROT 12.8  INR 0.97    Studies/Results: Dg Chest Port 1 View  Result Date: 05/18/2018 CLINICAL DATA:  Shortness of breath, weakness, stumbling, abdominal pain for 5 days, dark-colored stools, history of peptic ulcers, smoker EXAM: PORTABLE CHEST 1 VIEW COMPARISON:  Portable exam 0846 hours compared 04/24/2018 FINDINGS: Minimal enlargement of cardiac silhouette. Atherosclerotic calcification aorta. Mediastinal contours and pulmonary vascularity normal. Minimal subsegmental atelectasis at LEFT costophrenic angle. Lungs otherwise clear. No infiltrate, pleural effusion or pneumothorax. Bones demineralized. IMPRESSION: Minimal LEFT basilar subsegmental atelectasis. Electronically Signed   By: Ulyses Southward M.D.   On: 05/18/2018 09:22    Impression: Black stools, drop in Hb, baseline 10.7 on 05/02/18 and 7.3 today, hypotensive on admission, has a slightly high BUN/Cr ration, suspicious for UGI bleeding- could be PUD, varices(history of alcohol abuse- unclear if he has cirrhosis).  Plan: NPO, EGD depending on anesthesia availability(today or tomorrow) H and H monitoring and transfusion, currently set up for 2 units PRBC transfusion Started on protonix drip and octreotide  drip Hypokalemia- to receive 6 runs of 10 mEqKCL   LOS: 0 days   Kerin Salen, MD  05/18/2018, 10:46 AM  Pager [phone] If no answer or after 5 PM call [phone]

## 2018-05-18 NOTE — ED Triage Notes (Signed)
Patient arrived via [facility] from home. Patient was recently seen. Patient feeling weak. Stumbling. Patient pale. Reported abd. Pain that is ongoing for 5 days. 5days ago dark colored stools. Hx. peptic ulcers. Orthostatic and near syncopal after standing.  90/50 laying down.  60/40 while standing.  100cc of NS given per EMS.  CBG-123 18g left AC.

## 2018-05-18 NOTE — Consult Note (Addendum)
[facility]enterology Consult  Referring Provider: Dr.Butler/ER Primary Care Physician:  Patient, No Pcp Per Primary Gastroenterologist: Gentry Fitz  Reason for Consultation: black stools  HPI: Walter Nicholson is a 67 y.o. male was brought to the ER by EMS(called by his room mate) for weakness, frequent falls, dizziness and black liquid stools for at least 5 days. He was found to be hypotensive, 90/50 on laying and 60/40 on standing and was noted to have 2 large black stools in the ER. He c/o pain in RUQ for the past few days.  Patient states he has history of alcohol abuse, but has not had alcohol for over 9 months, lives in a sober house with a friend.Denies history of cirrhosis but was told he has an enlarged liver.  He does take Advil as needed and  takes diclofenac on a regular basis. He reports having an EGD performed in [place], [place] 4 months ago and was told he had PUD and was give PPI and sucralfate. He is originally from [place] and seems a little elusive when asked details of his symptoms and prior medical conditions.  He reports having a colonoscopy in [place] over 10 years ago.  He has been losing about 20 lbs in the last few months and reports difficult as well as pain on swallowing.  He smokes at least 1 PPD of cigarettes, used cocaine in the past, last use reported at least 2 years ago, denies IVDA.    Past Medical History:  Diagnosis Date  . Depression   . Hyperthyroidism     History reviewed. No pertinent surgical history.  Prior to Admission medications   Medication Sig Start Date End Date Taking? Authorizing Provider  acetaminophen (TYLENOL) 500 MG tablet Take 500 mg by mouth every 6 (six) hours as needed for moderate pain.    [provider]  citalopram (CELEXA) 40 MG tablet Take 1 tablet (40 mg total) by mouth daily. For depression 05/01/18   Armandina Stammer I, NP  diclofenac (VOLTAREN) 75 MG EC tablet Take 75 mg by mouth 2 (two) times daily.    [provider]  gabapentin (NEURONTIN) 400 MG capsule Take 2 capsules (800 mg total) by mouth 3 (three) times daily. For agitation 05/01/18   Armandina Stammer I, NP  hydrOXYzine (ATARAX/VISTARIL) 50 MG tablet Take 1 tablet (50 mg total) by mouth every 6 (six) hours as needed for anxiety. 05/01/18   Armandina Stammer I, NP  lamoTRIgine (LAMICTAL) 200 MG tablet Take 1 tablet (200 mg total) by mouth 2 (two) times daily. For mood stabilization 05/01/18   Armandina Stammer I, NP  levothyroxine (SYNTHROID, LEVOTHROID) 75 MCG tablet Take 1 tablet (75 mcg total) by mouth daily before breakfast. For thyroid hormone replacement 05/02/18   Armandina Stammer I, NP  lisinopril (PRINIVIL,ZESTRIL) 20 MG tablet Take 1 tablet (20 mg total) by mouth daily. For high blood pressure 05/01/18   Armandina Stammer I, NP  mirtazapine (REMERON) 30 MG tablet Take 1 tablet (30 mg total) by mouth at bedtime. For depression 05/01/18   Armandina Stammer I, NP  naproxen sodium (ALEVE) 220 MG tablet Take 440 mg by mouth daily as needed (pain).    [provider]  nicotine (NICODERM CQ - DOSED IN MG/24 HOURS) 21 mg/24hr patch Place 1 patch (21 mg total) onto the skin daily. (May buy from over the counter): For nicotine withdrawal Patient not taking: Reported on 05/02/2018 05/02/18   Armandina Stammer I, NP  pantoprazole (PROTONIX) 40 MG tablet Take  1 tablet (40 mg total) by mouth 2 (two) times daily. For acid reflux 05/01/18   Armandina Stammer I, NP  sucralfate (CARAFATE) 1 g tablet Take 1 tablet (1 g total) by mouth 4 (four) times daily -  with meals and at bedtime. For Ulcer 05/01/18   Armandina Stammer I, NP  traMADol (ULTRAM) 50 MG tablet Take 50 mg by mouth 3 (three) times daily as needed for moderate pain.    [provider]    Current Facility-Administered Medications  Medication Dose Route Frequency Provider Last Rate Last Dose  . octreotide (SANDOSTATIN) 500 mcg in sodium chloride 0.9 % 250 mL (2 mcg/mL) infusion  25 mcg/hr Intravenous Continuous Terrilee Files, MD 12.5 mL/hr at 05/18/18 1032 25 mcg/hr at 05/18/18 1032  . pantoprazole (PROTONIX) 80 mg in sodium chloride 0.9 % 250 mL (0.32 mg/mL) infusion  8 mg/hr Intravenous Continuous Zigmund Daniel., MD      . Melene Muller ON 05/21/2018] pantoprazole (PROTONIX) injection 40 mg  40 mg Intravenous Q12H Zigmund Daniel., MD      . pantoprazole (PROTONIX) injection 40 mg  40 mg Intravenous Once Zigmund Daniel., MD      . potassium chloride 10 mEq in 100 mL IVPB  10 mEq Intravenous Q1 Hr x 3 Terrilee Files, MD 100 mL/hr at 05/18/18 1041 10 mEq at 05/18/18 1041  . potassium chloride 10 mEq in 100 mL IVPB  10 mEq Intravenous Q1 Hr x 3 Zigmund Daniel., MD       Current Outpatient Medications  Medication Sig Dispense Refill  . acetaminophen (TYLENOL) 500 MG tablet Take 500 mg by mouth every 6 (six) hours as needed for moderate pain.    . citalopram (CELEXA) 40 MG tablet Take 1 tablet (40 mg total) by mouth daily. For depression 30 tablet 0  . diclofenac (VOLTAREN) 75 MG EC tablet Take 75 mg by mouth 2 (two) times daily.    Marland Kitchen gabapentin (NEURONTIN) 400 MG capsule Take 2 capsules (800 mg total) by mouth 3 (three) times daily. For agitation 180 capsule 0  . hydrOXYzine (ATARAX/VISTARIL) 50 MG tablet Take 1 tablet (50 mg total) by mouth every 6 (six) hours as needed for anxiety. 60 tablet 0  . lamoTRIgine (LAMICTAL) 200 MG tablet Take 1 tablet (200 mg total) by mouth 2 (two) times daily. For mood stabilization 60 tablet 0  . levothyroxine (SYNTHROID, LEVOTHROID) 75 MCG tablet Take 1 tablet (75 mcg total) by mouth daily before breakfast. For thyroid hormone replacement 7 tablet 0  . lisinopril (PRINIVIL,ZESTRIL) 20 MG tablet Take 1 tablet (20 mg total) by mouth daily. For high blood pressure 15 tablet 0  . mirtazapine (REMERON) 30 MG tablet Take 1 tablet (30 mg total) by mouth at bedtime. For depression 30 tablet 0  . naproxen sodium (ALEVE) 220 MG tablet Take 440 mg by mouth daily  as needed (pain).    . nicotine (NICODERM CQ - DOSED IN MG/24 HOURS) 21 mg/24hr patch Place 1 patch (21 mg total) onto the skin daily. (May buy from over the counter): For nicotine withdrawal (Patient not taking: Reported on 05/02/2018) 28 patch 0  . pantoprazole (PROTONIX) 40 MG tablet Take 1 tablet (40 mg total) by mouth 2 (two) times daily. For acid reflux 15 tablet 0  . sucralfate (CARAFATE) 1 g tablet Take 1 tablet (1 g total) by mouth 4 (four) times daily -  with meals and at bedtime. For Ulcer  60 tablet 0  . traMADol (ULTRAM) 50 MG tablet Take 50 mg by mouth 3 (three) times daily as needed for moderate pain.      Allergies as of 05/18/2018  . (No Known Allergies)    History reviewed. No pertinent family history.  Social History   Socioeconomic History  . Marital status: Single    Spouse name: Not on file  . Number of children: Not on file  . Years of education: Not on file  . Highest education level: Not on file  Occupational History  . Not on file  Social Needs  . Financial resource strain: Not on file  . Food insecurity:    Worry: Not on file    Inability: Not on file  . Transportation needs:    Medical: Not on file    Non-medical: Not on file  Tobacco Use  . Smoking status: Current Every Day Smoker  . Smokeless tobacco: Never Used  Substance and Sexual Activity  . Alcohol use: Yes  . Drug use: Not Currently  . Sexual activity: Not Currently  Lifestyle  . Physical activity:    Days per week: Not on file    Minutes per session: Not on file  . Stress: Not on file  Relationships  . Social connections:    Talks on phone: Not on file    Gets together: Not on file    Attends religious service: Not on file    Active member of club or organization: Not on file    Attends meetings of clubs or organizations: Not on file    Relationship status: Not on file  . Intimate partner violence:    Fear of current or ex partner: Not on file    Emotionally abused: Not on file     Physically abused: Not on file    Forced sexual activity: Not on file  Other Topics Concern  . Not on file  Social History Narrative  . Not on file    Review of Systems: Positive for: GI: Described in detail in HPI.    Gen:  anorexia, fatigue, weakness, malaise, involuntary weight loss, Denies any fever, chills, rigors, night sweats,and sleep disorder CV: Denies chest pain, angina, palpitations, syncope, orthopnea, PND, peripheral edema, and claudication. Resp: Denies dyspnea, cough, sputum, wheezing, coughing up blood. GU : Denies urinary burning, blood in urine, urinary frequency, urinary hesitancy, nocturnal urination, and urinary incontinence. MS: Denies joint pain or swelling.  Denies muscle weakness, cramps, atrophy.  Derm: Denies rash, itching, oral ulcerations, hives, unhealing ulcers.  Psych: Denies depression, anxiety, memory loss, suicidal ideation, hallucinations,  and confusion. Heme: Black stools, Denies bruising and enlarged lymph nodes. Neuro: dizziness,  Denies any headaches, paresthesias. Endo:  Denies any problems with DM, thyroid, adrenal function.  Physical Exam: Vital signs in last 24 hours: Temp:  [98.6 F (37 C)-98.8 F (37.1 C)] 98.8 F (37.1 C) (10/11 1002) Pulse Rate:  [75-97] 83 (10/11 1030) Resp:  [14-15] 15 (10/11 1030) BP: (91-126)/(51-61) 115/61 (10/11 1030) SpO2:  [89 %-100 %] 94 % (10/11 1030)    General:   Alert,  Well-developed, well-nourished, pleasant and cooperative in NAD Head:  Normocephalic and atraumatic. Eyes:  Sclera clear, no icterus,  pallor. Ears:  Normal auditory acuity. Nose:  No deformity, discharge,  or lesions. Mouth:  No deformity or lesions.  Oropharynx pink & moist. Neck:  Supple; no masses or thyromegaly. Lungs:  Clear throughout to auscultation.   No wheezes, crackles, or rhonchi. No acute  distress. Heart:  Regular rate and rhythm; no murmurs, clicks, rubs,  or gallops. Extremities:  Without clubbing or  edema. Neurologic:  Alert and  oriented x4;  grossly normal neurologically. Skin:  Intact without significant lesions or rashes. Psych:  Alert and cooperative. Normal mood and affect. Abdomen:  Soft, mild ruq tenderness and nondistended. No masses, hepatosplenomegaly or hernias noted. Normal bowel sounds, without guarding, and without rebound.         Lab Results: Recent Labs    05/18/18 0827  WBC 8.2  HGB 7.3*  HCT 23.8*  PLT 341   BMET Recent Labs    05/18/18 0827  NA 142  K 2.9*  CL 104  CO2 28  GLUCOSE 111*  BUN 18  CREATININE 0.94  CALCIUM 8.7*   LFT Recent Labs    05/18/18 0827  PROT 6.5  ALBUMIN 3.4*  AST 47*  ALT 34  ALKPHOS 137*  BILITOT 0.6   PT/INR Recent Labs    05/18/18 0827  LABPROT 12.8  INR 0.97    Studies/Results: Dg Chest Port 1 View  Result Date: 05/18/2018 CLINICAL DATA:  Shortness of breath, weakness, stumbling, abdominal pain for 5 days, dark-colored stools, history of peptic ulcers, smoker EXAM: PORTABLE CHEST 1 VIEW COMPARISON:  Portable exam 0846 hours compared 04/24/2018 FINDINGS: Minimal enlargement of cardiac silhouette. Atherosclerotic calcification aorta. Mediastinal contours and pulmonary vascularity normal. Minimal subsegmental atelectasis at LEFT costophrenic angle. Lungs otherwise clear. No infiltrate, pleural effusion or pneumothorax. Bones demineralized. IMPRESSION: Minimal LEFT basilar subsegmental atelectasis. Electronically Signed   By: Ulyses Southward M.D.   On: 05/18/2018 09:22    Impression: Black stools, drop in Hb, baseline 10.7 on 05/02/18 and 7.3 today, hypotensive on admission, has a slightly high BUN/Cr ration, suspicious for UGI bleeding- could be PUD, varices(history of alcohol abuse- unclear if he has cirrhosis).  Plan: NPO, EGD depending on anesthesia availability(today or tomorrow) H and H monitoring and transfusion, currently set up for 2 units PRBC transfusion Started on protonix drip and octreotide  drip Hypokalemia- to receive 6 runs of 10 mEqKCL   LOS: 0 days   Kerin Salen, MD  05/18/2018, 10:46 AM  Pager [phone] If no answer or after 5 PM call [phone]

## 2018-05-18 NOTE — ED Notes (Signed)
Admitting provider at bedside. Will start octreotide with MD finished on computer.

## 2018-05-18 NOTE — Op Note (Signed)
[facility] Patient Name: Walter Nicholson Procedure Date: 05/18/2018 MRN: [id number] Attending MD: Kerin Salen , MD Date of Birth: [birth year] CSN: [id number] Age: 67 Admit Type: Inpatient Procedure:                Upper GI endoscopy Indications:              Melena Providers:                Kerin Salen, MD, Norman Clay, RN, Kandice Robinsons,                            Technician Referring MD:              Medicines:                Monitored Anesthesia Care Complications:            No immediate complications. Estimated blood loss:                            None. Estimated Blood Loss:     Estimated blood loss was minimal. Procedure:                Pre-Anesthesia Assessment:                           - Prior to the procedure, a History and Physical                            was performed, and patient medications and                            allergies were reviewed. The patient's tolerance of                            previous anesthesia was also reviewed. The risks                            and benefits of the procedure and the sedation                            options and risks were discussed with the patient.                            All questions were answered, and informed consent                            was obtained. Prior Anticoagulants: The patient has                            taken no previous anticoagulant or antiplatelet                            agents. ASA Grade Assessment: IV - A patient with                            severe systemic disease that  is a constant threat                            to life. After reviewing the risks and benefits,                            the patient was deemed in satisfactory condition to                            undergo the procedure.                           After obtaining informed consent, the endoscope was                            passed under direct vision. Throughout the                             procedure, the patient's blood pressure, pulse, and                            oxygen saturations were monitored continuously. The                            GIF-H190 ([id number]) Olympus adult endoscope was                            introduced through the mouth, and advanced to the                            second part of duodenum. The upper GI endoscopy was                            accomplished without difficulty. The patient                            tolerated the procedure well. Scope In: Scope Out: Findings:      LA Grade C (one or more mucosal breaks continuous between tops of 2 or       more mucosal folds, less than 75% circumference) esophagitis with no       bleeding was found 35 to 40 cm from the incisors.      The Z-line was found 40 cm from the incisors.      Coffee ground fluid was found in the gastric body.      Localized mildly erythematous mucosa without bleeding was found in the       gastric antrum.      The cardia and gastric fundus were normal on retroflexion.      One large, at least 4-5 cm, cratered ulcer was noted in the first potion       of duodenum with a large adherent clot which could not be displaced on       lavage. The rest of the duodenum appeared unremarkbale. The ulcer was       too large for endoscopic intervention and could not be evaluated below  the adherent clot. Impression:               - LA Grade C esophagitis.                           - Z-line, 40 cm from the incisors.                           - Coffee ground gastric fluid.                           - Erythematous mucosa in the antrum.                           - One large cratered non-bleeding duodenal ulcer                            with large adherent clot.                           - No specimens collected. Moderate Sedation:      Patient did not receive moderate sedation for this procedure, but       instead received monitored anesthesia care. Recommendation:           -  NPO.                           - Give Protonix (pantoprazole): 8 mg/hr IV by                            continuous infusion.                           - Refer to an interventional radiologist. Discussed                            with IR physician for embolization of GDA. Procedure Code(s):        --- Professional ---                           [phone], Esophagogastroduodenoscopy, flexible,                            transoral; diagnostic, including collection of                            specimen(s) by brushing or washing, when performed                            (separate procedure) Diagnosis Code(s):        --- Professional ---                           K20.9, Esophagitis, unspecified                           K31.89, Other diseases of stomach and duodenum  K26.4, Chronic or unspecified duodenal ulcer with                            hemorrhage                           K92.1, Melena (includes Hematochezia) CPT copyright 2018 [employer]. All rights reserved. The codes documented in this report are preliminary and upon coder review may  be revised to meet current compliance requirements. Kerin Salen, MD 05/18/2018 2:09:13 PM This report has been signed electronically. Number of Addenda: 0

## 2018-05-18 NOTE — Sedation Documentation (Signed)
Patient is resting comfortably, snoring lightly, in NAD. 

## 2018-05-18 NOTE — Procedures (Signed)
Interventional Radiology Procedure Note  Procedure: Mesenteric angiogram and embolization of bleeding/damaged segment of the GDA.   Vascular Access: Right CFA, 40F --> Cordis ExoSeal  Complications: None  Estimated Blood Loss: None  Recommendations: - Admit, trend H&H and transfuse as needed - Clears only until advanced by GI - Bedrest with leg straight x 4 hrs   Signed,  Sterling Big, MD

## 2018-05-18 NOTE — ED Notes (Addendum)
Report given to ICU nurse. ICU nurse request I give her 15 min before transporting upstairs due to her having to transport another patient.

## 2018-05-18 NOTE — ED Notes (Signed)
ED TO INPATIENT HANDOFF REPORT  Name/Age/Gender Walter Nicholson 67 y.o. male  Code Status Code Status History    Date Active Date Inactive Code Status Order ID Comments User Context   05/02/2018 1949 05/03/2018 1240 Full Code [id number]  Charlesetta Shanks, MD ED   04/24/2018 1737 05/01/2018 1822 Full Code [id number]  Nanci Pina, [facility]   04/24/2018 1737 04/24/2018 1737 Full Code [id number]  Nanci Pina, [facility]   04/22/2018 0958 04/24/2018 1705 Full Code [id number]  Malvin Johns, MD ED      Home/SNF/Other Home  Chief Complaint Hypotensive, GI Bleed  Level of Care/Admitting Diagnosis ED Disposition    ED Disposition Condition [facility] Area: [place] [100102]  Level of Care: Stepdown [14]  Admit to SDU based on following criteria: Hemodynamic compromise or significant risk of instability:  Patient requiring short term acute titration and management of vasoactive drips, and invasive monitoring (i.e., CVP and Arterial line).  Diagnosis: GI bleed [629528]  Admitting Physician: Elodia Florence [[id number]]  Attending Physician: Cephus Slater, A CAL[place]WELL [phone]  Estimated length of stay: past midnight tomorrow  Certification:: I certify this patient will need inpatient services for at least 2 midnights  PT Class (Do Not Modify): Inpatient [101]  PT Acc Code (Do Not Modify): Private [1]       Medical History Past Medical History:  Diagnosis Date  . Depression   . Hyperthyroidism     Allergies No Known Allergies  IV Location/Drains/Wounds Patient Lines/Drains/Airways Status   Active Line/Drains/Airways    Name:   Placement date:   Placement time:   Site:   Days:   Peripheral IV 05/18/18 Left Antecubital   05/18/18    0813    Antecubital   less than 1   Peripheral IV 05/18/18 Right Antecubital   05/18/18    0834    Antecubital   less than 1   Peripheral IV 05/18/18 Right Hand   05/18/18    1105    Hand   less than 1           Labs/Imaging Results for orders placed or performed during the hospital encounter of 05/18/18 (from the past 48 hour(s))  Comprehensive metabolic panel     Status: Abnormal   Collection Time: 05/18/18  8:27 AM  Result Value Ref Range   Sodium 142 135 - 145 mmol/L   Potassium 2.9 (L) 3.5 - 5.1 mmol/L   Chloride 104 98 - 111 mmol/L   CO2 28 22 - 32 mmol/L   Glucose, Bld 111 (H) 70 - 99 mg/dL   BUN 18 8 - 23 mg/dL   Creatinine, Ser 0.94 0.61 - 1.24 mg/dL   Calcium 8.7 (L) 8.9 - 10.3 mg/dL   Total Protein 6.5 6.5 - 8.1 g/dL   Albumin 3.4 (L) 3.5 - 5.0 g/dL   AST 47 (H) 15 - 41 U/L   ALT 34 0 - 44 U/L   Alkaline Phosphatase 137 (H) 38 - 126 U/L   Total Bilirubin 0.6 0.3 - 1.2 mg/dL   GFR calc non Af Amer >60 >60 mL/min   GFR calc Af Amer >60 >60 mL/min    Comment: (NOTE) The eGFR has been calculated using the CKD EPI equation. This calculation has not been validated in all clinical situations. eGFR's persistently <60 mL/min signify possible Chronic Kidney Disease.    Anion gap 10 5 - 15    Comment: Performed at [facility] [address] [place], [place] [address]  CBC WITH DIFFERENTIAL     Status: Abnormal   Collection Time: 05/18/18  8:27 AM  Result Value Ref Range   WBC 8.2 4.0 - 10.5 K/uL   RBC 2.88 (L) 4.22 - 5.81 MIL/uL   Hemoglobin 7.3 (L) 13.0 - 17.0 g/dL   HCT 23.8 (L) 39.0 - 52.0 %   MCV 82.6 80.0 - 100.0 fL   MCH 25.3 (L) 26.0 - 34.0 pg   MCHC 30.7 30.0 - 36.0 g/dL   RDW 19.7 (H) 11.5 - 15.5 %   Platelets 341 150 - 400 K/uL   nRBC 0.0 0.0 - 0.2 %   Neutrophils Relative % 78 %   Neutro Abs 6.4 1.7 - 7.7 K/uL   Lymphocytes Relative 11 %   Lymphs Abs 0.9 0.7 - 4.0 K/uL   Monocytes Relative 9 %   Monocytes Absolute 0.8 0.1 - 1.0 K/uL   Eosinophils Relative 1 %   Eosinophils Absolute 0.1 0.0 - 0.5 K/uL   Basophils Relative 0 %   Basophils Absolute 0.0 0.0 - 0.1 K/uL   Immature Granulocytes 1 %   Abs Immature Granulocytes 0.07  0.00 - 0.07 K/uL    Comment: Performed at [facility], [place] [address] [place], [place] [address]  Protime-INR     Status: None   Collection Time: 05/18/18  8:27 AM  Result Value Ref Range   Prothrombin Time 12.8 11.4 - 15.2 seconds   INR 0.97     Comment: Performed at [facility], [place] [address], [place], [place] [address]  Type and screen [place]     Status: None (Preliminary result)   Collection Time: 05/18/18  8:27 AM  Result Value Ref Range   ABO/RH(D) A POS    Antibody Screen NEG    Sample Expiration 05/21/2018    Unit Number [id number]    Blood Component Type RED CELLS,LR    Unit division 00    Status of Unit ISSUED    Transfusion Status OK TO TRANSFUSE    Crossmatch Result      Compatible Performed at [facility], [place] [address] [place], [place] [address]    Unit Number [id number]    Blood Component Type RED CELLS,LR    Unit division 00    Status of Unit ALLOCATED    Transfusion Status OK TO TRANSFUSE    Crossmatch Result Compatible   POC occult blood, ED RN will collect     Status: Abnormal   Collection Time: 05/18/18  8:29 AM  Result Value Ref Range   Fecal Occult Bld POSITIVE (A) NEGATIVE  I-stat troponin, ED     Status: None   Collection Time: 05/18/18  8:32 AM  Result Value Ref Range   Troponin i, poc 0.01 0.00 - 0.08 ng/mL   Comment 3            Comment: Due to the release kinetics of cTnI, a negative result within the first hours of the onset of symptoms does not rule out myocardial infarction with certainty. If myocardial infarction is still suspected, repeat the test at appropriate intervals.   ABO/Rh     Status: None   Collection Time: 05/18/18  8:32 AM  Result Value Ref Range   ABO/RH(D)      A POS Performed at [facility], [place] [address] [place], [place] [address]   Magnesium  Status: None   Collection Time: 05/18/18  8:40 AM   Result Value Ref Range   Magnesium 2.2 1.7 - 2.4 mg/dL    Comment: Performed at [facility], [place] [address] [place], [place] [address]  Prepare RBC     Status: None   Collection Time: 05/18/18  9:13 AM  Result Value Ref Range   Order Confirmation      ORDER PROCESSED BY BLOOD BANK Performed at [facility], [place] [address] [place], [place] [address]    Dg Chest Port 1 View  Result Date: 05/18/2018 CLINICAL DATA:  Shortness of breath, weakness, stumbling, abdominal pain for 5 days, dark-colored stools, history of peptic ulcers, smoker EXAM: PORTABLE CHEST 1 VIEW COMPARISON:  Portable exam 0846 hours compared 04/24/2018 FINDINGS: Minimal enlargement of cardiac silhouette. Atherosclerotic calcification aorta. Mediastinal contours and pulmonary vascularity normal. Minimal subsegmental atelectasis at LEFT costophrenic angle. Lungs otherwise clear. No infiltrate, pleural effusion or pneumothorax. Bones demineralized. IMPRESSION: Minimal LEFT basilar subsegmental atelectasis. Electronically Signed   By: Lavonia Dana M.D.   On: 05/18/2018 09:22   EKG Interpretation  Date/Time:  Friday May 18 2018 08:20:17 EDT Ventricular Rate:  97 PR Interval:    QRS Duration: 82 QT Interval:  383 QTC Calculation: 487 R Axis:   83 Text Interpretation:  Sinus rhythm Borderline right axis deviation Consider left ventricular hypertrophy Borderline prolonged QT interval similar pattern to prior 9/19 Confirmed by Aletta Edouard [phone]) on 05/18/2018 8:27:38 AM Also confirmed by Aletta Edouard [phone]), editor Lynder Parents [phone])  on 05/18/2018 10:07:51 AM   Pending Labs Unresulted Labs (From admission, onward)    Start     Ordered   Signed and Held  HIV antibody (Routine Testing)  Once,   R     Signed and Held   Signed and Held  Comprehensive metabolic panel  Tomorrow morning,   R     Signed and Held   Signed and Held  CBC  Tomorrow morning,   R     Signed and  Held   Signed and Held  Hemoglobin and hematocrit, blood  Now then every 6 hours,   R     Signed and Held          Vitals/Pain Today's Vitals   05/18/18 1002 05/18/18 1030 05/18/18 1100 05/18/18 1130  BP: (!) 126/59 115/61 119/67 (!) 115/58  Pulse: 75 83 77 78  Resp: '14 15 19 13  '$ Temp: 98.8 F (37.1 C)     TempSrc: Oral     SpO2: 100% 94% 100% 100%    Isolation Precautions No active isolations  Medications Medications  potassium chloride 10 mEq in 100 mL IVPB (10 mEq Intravenous New Bag/Given 05/18/18 1147)  octreotide (SANDOSTATIN) 2 mcg/mL load via infusion 50 mcg (50 mcg Intravenous Bolus from Bag 05/18/18 1032)    And  octreotide (SANDOSTATIN) 500 mcg in sodium chloride 0.9 % 250 mL (2 mcg/mL) infusion (25 mcg/hr Intravenous New Bag/Given 05/18/18 1032)  pantoprazole (PROTONIX) 80 mg in sodium chloride 0.9 % 250 mL (0.32 mg/mL) infusion (8 mg/hr Intravenous New Bag/Given 05/18/18 1121)  pantoprazole (PROTONIX) injection 40 mg (has no administration in time range)  potassium chloride 10 mEq in 100 mL IVPB (has no administration in time range)  pantoprazole (PROTONIX) injection 40 mg (40 mg Intravenous Given 05/18/18 0830)  sodium chloride 0.9 % bolus 1,000 mL (0 mLs Intravenous Stopped 05/18/18 0922)  0.9 %  sodium chloride infusion (10 mL/hr Intravenous New  Bag/Given 05/18/18 0921)  pantoprazole (PROTONIX) injection 40 mg (40 mg Intravenous Given 05/18/18 1123)    Mobility walks with person assist (

## 2018-05-18 NOTE — H&P (Addendum)
History and Physical    Walter Nicholson XBM:[id number] DOB: 26-Apr-1951 DOA: 05/18/2018  PCP: Patient, No Pcp Per  Patient coming from: home  I have personally briefly reviewed patient's old medical records in [place]  Chief Complaint: fatigue, malaise, syncope  HPI: Walter Nicholson is a 67 y.o. male with medical history significant of depression, bipolar disorder, thyroid disease, alcohol abuse hypertension presenting with fatigue, lightheadedness and black stool.  He notes that his symptoms started about 5 days ago.  At that point time he started to notice black stool.  He is also noticed lightheadedness with standing and difficulty walking because the lightheadedness.  He was getting out of his bed and walking to the living room when he fell down and fainted in the living room.  He denies any notable head trauma or injury.  Someone found him in the living room and called EMS.  He notes shortness of breath with exertion for the past few weeks.  He notes lethargy, chills.  He denies any fevers.  He notes chest discomfort and that spreads from his upper map upper abdomen to his chest and feels like pressure and sharp pain.  He notes tenderness in the upper abdomen to palpation.  He denies any recent alcohol use over the past 2 months.  He smokes a pack a day.  He uses Aleve and Voltaren daily.  ED Course: Labs, imaging, transfuse 2 units pRBC.  GI consult.  Hospitalist to admit.  Review of Systems: As per HPI otherwise 10 point review of systems negative.   Past Medical History:  Diagnosis Date  . Depression   . Hyperthyroidism     History reviewed. No pertinent surgical history.   reports that he has been smoking. He has never used smokeless tobacco. He reports that he drinks alcohol. He reports that he has current or past drug history.  No Known Allergies  History reviewed. No pertinent family history.  Prior to Admission medications   Medication Sig Start Date End  Date Taking? Authorizing Provider  acetaminophen (TYLENOL) 500 MG tablet Take 500 mg by mouth every 6 (six) hours as needed for moderate pain.    [provider]  citalopram (CELEXA) 40 MG tablet Take 1 tablet (40 mg total) by mouth daily. For depression 05/01/18   Lindell Spar I, NP  diclofenac (VOLTAREN) 75 MG EC tablet Take 75 mg by mouth 2 (two) times daily.    [provider]  gabapentin (NEURONTIN) 400 MG capsule Take 2 capsules (800 mg total) by mouth 3 (three) times daily. For agitation 05/01/18   Lindell Spar I, NP  hydrOXYzine (ATARAX/VISTARIL) 50 MG tablet Take 1 tablet (50 mg total) by mouth every 6 (six) hours as needed for anxiety. 05/01/18   Lindell Spar I, NP  lamoTRIgine (LAMICTAL) 200 MG tablet Take 1 tablet (200 mg total) by mouth 2 (two) times daily. For mood stabilization 05/01/18   Lindell Spar I, NP  levothyroxine (SYNTHROID, LEVOTHROID) 75 MCG tablet Take 1 tablet (75 mcg total) by mouth daily before breakfast. For thyroid hormone replacement 05/02/18   Lindell Spar I, NP  lisinopril (PRINIVIL,ZESTRIL) 20 MG tablet Take 1 tablet (20 mg total) by mouth daily. For high blood pressure 05/01/18   Lindell Spar I, NP  mirtazapine (REMERON) 30 MG tablet Take 1 tablet (30 mg total) by mouth at bedtime. For depression 05/01/18   Lindell Spar I, NP  naproxen sodium (ALEVE) 220 MG tablet Take 440 mg by mouth daily  as needed (pain).    [provider]  nicotine (NICODERM CQ - DOSED IN MG/24 HOURS) 21 mg/24hr patch Place 1 patch (21 mg total) onto the skin daily. (May buy from over the counter): For nicotine withdrawal Patient not taking: Reported on 05/02/2018 05/02/18   Lindell Spar I, NP  pantoprazole (PROTONIX) 40 MG tablet Take 1 tablet (40 mg total) by mouth 2 (two) times daily. For acid reflux 05/01/18   Lindell Spar I, NP  sucralfate (CARAFATE) 1 g tablet Take 1 tablet (1 g total) by mouth 4 (four) times daily -  with meals and at bedtime. For Ulcer 05/01/18   Lindell Spar I, NP  traMADol (ULTRAM) 50 MG tablet Take 50 mg by mouth 3 (three) times daily as needed for moderate pain.    [provider]    Physical Exam: Vitals:   05/18/18 0930 05/18/18 0947 05/18/18 1002 05/18/18 1030  BP: (!) 115/55 (!) 115/55 (!) 126/59 115/61  Pulse: 85 85 75 83  Resp: '15 15 14 15  '$ Temp: 98.6 F (37 C) 98.6 F (37 C) 98.8 F (37.1 C)   TempSrc: Oral Oral Oral   SpO2: 100% 100% 100% 94%    Constitutional: NAD, calm, comfortable, pale Vitals:   05/18/18 0930 05/18/18 0947 05/18/18 1002 05/18/18 1030  BP: (!) 115/55 (!) 115/55 (!) 126/59 115/61  Pulse: 85 85 75 83  Resp: '15 15 14 15  '$ Temp: 98.6 F (37 C) 98.6 F (37 C) 98.8 F (37.1 C)   TempSrc: Oral Oral Oral   SpO2: 100% 100% 100% 94%  [place]/AT Eyes: PERRL, lids and conjunctivae normal ENMT: Mucous membranes are moist. Posterior pharynx clear of any exudate or lesions.Normal dentition.  Neck: normal, supple, no masses, no thyromegaly Respiratory: clear to auscultation bilaterally, no wheezing, no crackles. Normal respiratory effort. No accessory muscle use.  Cardiovascular: Regular rate and rhythm, no murmurs / rubs / gallops. No extremity edema. No carotid bruits.  Abdomen: mild epigastric tenderness, no masses palpated. No hepatosplenomegaly. Bowel sounds positive.  Musculoskeletal: no clubbing / cyanosis. No joint deformity upper and lower extremities. Good ROM, no contractures. Normal muscle tone.  No midline TTP to C/T/L spine, no ttp to arms/legs, pelvis stable. Skin: no rashes, lesions, ulcers. No induration Neurologic: CN 2-12 grossly intact. Sensation intact. Strength 5/5 in all 4.  Psychiatric: Normal judgment and insight. Alert and oriented x 3. Normal mood.   Labs on Admission: I have personally reviewed following labs and imaging studies  CBC: Recent Labs  Lab 05/18/18 0827  WBC 8.2  NEUTROABS 6.4  HGB 7.3*  HCT 23.8*  MCV 82.6  PLT 865   Basic Metabolic Panel: Recent Labs   Lab 05/18/18 0827  NA 142  K 2.9*  CL 104  CO2 28  GLUCOSE 111*  BUN 18  CREATININE 0.94  CALCIUM 8.7*   GFR: CrCl cannot be calculated (Unknown ideal weight.). Liver Function Tests: Recent Labs  Lab 05/18/18 0827  AST 47*  ALT 34  ALKPHOS 137*  BILITOT 0.6  PROT 6.5  ALBUMIN 3.4*   No results for input(s): LIPASE, AMYLASE in the last 168 hours. No results for input(s): AMMONIA in the last 168 hours. Coagulation Profile: Recent Labs  Lab 05/18/18 0827  INR 0.97   Cardiac Enzymes: No results for input(s): CKTOTAL, CKMB, CKMBINDEX, TROPONINI in the last 168 hours. BNP (last 3 results) No results for input(s): PROBNP in the last 8760 hours. HbA1C: No results for input(s): HGBA1C in  the last 72 hours. CBG: No results for input(s): GLUCAP in the last 168 hours. Lipid Profile: No results for input(s): CHOL, HDL, LDLCALC, TRIG, CHOLHDL, LDLDIRECT in the last 72 hours. Thyroid Function Tests: No results for input(s): TSH, T4TOTAL, FREET4, T3FREE, THYROIDAB in the last 72 hours. Anemia Panel: No results for input(s): VITAMINB12, FOLATE, FERRITIN, TIBC, IRON, RETICCTPCT in the last 72 hours. Urine analysis:    Component Value Date/Time   COLORURINE YELLOW 04/24/2018 0940   APPEARANCEUR CLEAR 04/24/2018 0940   LABSPEC 1.005 04/24/2018 0940   PHURINE 7.0 04/24/2018 0940   GLUCOSEU NEGATIVE 04/24/2018 0940   HGBUR NEGATIVE 04/24/2018 0940   BILIRUBINUR NEGATIVE 04/24/2018 0940   KETONESUR NEGATIVE 04/24/2018 0940   PROTEINUR NEGATIVE 04/24/2018 0940   NITRITE NEGATIVE 04/24/2018 0940   LEUKOCYTESUR NEGATIVE 04/24/2018 0940    Radiological Exams on Admission: Dg Chest Port 1 View  Result Date: 05/18/2018 CLINICAL DATA:  Shortness of breath, weakness, stumbling, abdominal pain for 5 days, dark-colored stools, history of peptic ulcers, smoker EXAM: PORTABLE CHEST 1 VIEW COMPARISON:  Portable exam 0846 hours compared 04/24/2018 FINDINGS: Minimal enlargement of  cardiac silhouette. Atherosclerotic calcification aorta. Mediastinal contours and pulmonary vascularity normal. Minimal subsegmental atelectasis at LEFT costophrenic angle. Lungs otherwise clear. No infiltrate, pleural effusion or pneumothorax. Bones demineralized. IMPRESSION: Minimal LEFT basilar subsegmental atelectasis. Electronically Signed   By: Lavonia Dana M.D.   On: 05/18/2018 09:22    EKG: Independently reviewed. NSR.  Prolonged QTc.  Appears similar to priors.   Assessment/Plan Active Problems:   GI bleed  GI bleed  Acute Blood Loss Anemia  Abdominal Discomfort: 5 days of black stool with associated LH/faintness with ambulation and SOB with exertion.  Has hx of etoh abuse, but denies use in about 2 months.  He's taking PO voltaren and aleve daily.  He has hx of PUD.  Suspect upper GI bleed. Hb down to 7.3 from 10.7 in 04/2018. 2 PIV Transfuse 2 units pRBC Trend H/H PPI gtt, octreotide  GI c/s, appreciate recs  Syncope  Orthostasis: Had episode of syncope this morning.  No apparent injury on exam and no focal neuro deficit appreciated.  suspect this is 2/2 symptomatic anemia given above, continue to monitor after transfusion and treatment of above.    Hypotension: Noted with EMS with BP of 90/50 when lying down and as low as 60/40 when standing, now improved.  Follow with transfusion and treatment.  Dyspnea on Exertion: 2/2 symptomatic anemia  Chest discomfort: described chest discomfort, starts in abdomen and radiates up towards chest.  Denies any CP at this time and last noticed this last night ~8 PM.  POC troponin on presentation negative and EKG appeared consistent with priors.  Suspect this may be due to #1, continue to monitor for now, consider additional w/u if recurrent.  Hypertension: holding lisinopril in setting of above  Mildly Elevated AST and alk phos: follow  Hx of etoh abuse: denies use for 2 months  Bipolar Disorder  Depression: currently holding both celexa  and lamictal and remeron and gabapentin as he's NPO, will need to restart when able  Hypothyroidism: holding synthroid for now while NPO, resume when able  Smoking: encourage cessation, nicotine patch  Hypokalemia: replete, follow magnesium  Prolonged QTc: mild, follow repeat EKG in AM, follow mag.  Caution with QT prolonging meds.  DVT prophylaxis: SCD  Code Status: full  Family Communication: none at bedside, pt declined me calling any family  Disposition Plan: pending improvement  Consults  called: Therisa Doyne, GI  Admission status: stepdown, pt with active GI bleed with symptomatic anemia with hypotension on initial presentation placing him at risk of decompensation.  He'll require greater than 2 midnight stay for stabilization and treatment.   Fayrene Helper MD [facility] Pager [phone]  If 7PM-7AM, please contact night-coverage [email or web address] Password [email or web address]  05/18/2018, 10:48 AM

## 2018-05-18 NOTE — Anesthesia Procedure Notes (Signed)
Procedure Name: MAC Date/Time: 05/18/2018 1:36 PM Performed by: Maxwell Caul, CRNA Pre-anesthesia Checklist: Patient identified, Emergency Drugs available, Suction available and Patient being monitored Patient Re-evaluated:Patient Re-evaluated prior to induction Oxygen Delivery Method: Nasal cannula

## 2018-05-18 NOTE — Interval H&P Note (Signed)
History and Physical Interval Note: 66/male with melena, anemia, hypotension, history of alcohol abuse and NSAID use for an EGD.  05/18/2018 1:35 PM  Walter Nicholson  has presented today for EGD with possible banding, with the diagnosis of Melena  The various methods of treatment have been discussed with the patient and family. After consideration of risks, benefits and other options for treatment, the patient has consented to  Procedure(s): ESOPHAGOGASTRODUODENOSCOPY (EGD) WITH PROPOFOL (N/A) as a surgical intervention .  The patient's history has been reviewed, patient examined, no change in status, stable for surgery.  I have reviewed the patient's chart and labs.  Questions were answered to the patient's satisfaction.     Kerin Salen

## 2018-05-18 NOTE — Progress Notes (Signed)
Transported to IR for emergent procedure.  Notified floor RN.

## 2018-05-18 NOTE — ED Notes (Signed)
Messaged Pharmacy to send Octreotide.

## 2018-05-18 NOTE — Transfer of Care (Signed)
Immediate Anesthesia Transfer of Care Note  Patient: Walter Nicholson  Procedure(s) Performed: ESOPHAGOGASTRODUODENOSCOPY (EGD) WITH PROPOFOL (N/A )  Patient Location: PACU  Anesthesia Type:MAC  Level of Consciousness: awake, alert  and oriented  Airway & Oxygen Therapy: Patient Spontanous Breathing and Patient connected to nasal cannula oxygen  Post-op Assessment: Report given to RN and Post -op Vital signs reviewed and stable  Post vital signs: Reviewed and stable  Last Vitals:  Vitals Value Taken Time  BP 104/39 05/18/2018  1:57 PM  Temp 36.9 C 05/18/2018  1:57 PM  Pulse 77 05/18/2018  1:57 PM  Resp 24 05/18/2018  1:57 PM  SpO2 100 % 05/18/2018  1:57 PM    Last Pain:  Vitals:   05/18/18 1357  TempSrc: Oral  PainSc:          Complications: No apparent anesthesia complications

## 2018-05-18 NOTE — Anesthesia Preprocedure Evaluation (Addendum)
Anesthesia Evaluation  Patient identified by MRN, date of birth, ID band Patient awake    Reviewed: Allergy & Precautions, H&P , NPO status , Patient's Chart, lab work & pertinent test results  Airway Mallampati: II  TM Distance: >3 FB Neck ROM: Full    Dental no notable dental hx. (+) Poor Dentition,    Pulmonary Current Smoker,    Pulmonary exam normal breath sounds clear to auscultation       Cardiovascular hypertension, Pt. on medications Normal cardiovascular exam Rhythm:Regular Rate:Normal     Neuro/Psych PSYCHIATRIC DISORDERS Bipolar Disorder    GI/Hepatic GERD  Medicated,(+)     substance abuse  alcohol use,   Endo/Other    Renal/GU      Musculoskeletal   Abdominal   Peds  Hematology  (+) anemia , Anemia 7.3   Anesthesia Other Findings   Reproductive/Obstetrics                            Lab Results  Component Value Date   WBC 8.2 05/18/2018   HGB 7.3 (L) 05/18/2018   HCT 23.8 (L) 05/18/2018   MCV 82.6 05/18/2018   PLT 341 05/18/2018    Anesthesia Physical Anesthesia Plan  ASA: IV  Anesthesia Plan: MAC   Post-op Pain Management:    Induction: Intravenous  PONV Risk Score and Plan: Treatment may vary due to age or medical condition  Airway Management Planned: Nasal Cannula and Natural Airway  Additional Equipment:   Intra-op Plan:   Post-operative Plan:   Informed Consent: I have reviewed the patients History and Physical, chart, labs and discussed the procedure including the risks, benefits and alternatives for the proposed anesthesia with the patient or authorized representative who has indicated his/her understanding and acceptance.   Dental advisory given  Plan Discussed with: CRNA  Anesthesia Plan Comments:         Anesthesia Quick Evaluation

## 2018-05-18 NOTE — Anesthesia Postprocedure Evaluation (Signed)
Anesthesia Post Note  Patient: Walter Nicholson  Procedure(s) Performed: ESOPHAGOGASTRODUODENOSCOPY (EGD) WITH PROPOFOL (N/A )     Patient location during evaluation: Endoscopy Anesthesia Type: MAC Level of consciousness: awake and alert Pain management: pain level controlled Vital Signs Assessment: post-procedure vital signs reviewed and stable Respiratory status: spontaneous breathing, nonlabored ventilation, respiratory function stable and patient connected to nasal cannula oxygen Cardiovascular status: stable and blood pressure returned to baseline Postop Assessment: no apparent nausea or vomiting Anesthetic complications: no    Last Vitals:  Vitals:   05/18/18 1400 05/18/18 1410  BP: (!) 109/42   Pulse: 70 73  Resp: 17 12  Temp:    SpO2: 100% 100%    Last Pain:  Vitals:   05/18/18 1410  TempSrc:   PainSc: 0-No pain                 Barnet Glasgow

## 2018-05-18 NOTE — ED Notes (Signed)
Patient had 2nd occurrence of medium gooey/diarrhea like black stool. Patient used bed pan. Urinal at bedside.

## 2018-05-18 NOTE — Op Note (Signed)
EGD findings:   Esophagitis noted from 35-40 cm. Coffee ground fluid noted in gastric cavity. Mild antral erythema. Normal cardia and fundus.  One large cratered ulcer, at least 4-5 cm in size with a large adherent clot which could not be displaced on lavage.  Recommendations: IR guided embolization of GDA, discussed with Dr.McCullough from IR. NPO Protonix drip at 8 mg/hr.  Kerin Salen, MD

## 2018-05-18 NOTE — ED Notes (Signed)
Verified with Britt Bottom Powel,MD. Patient is to receive a total of 6 IVP potassium's.

## 2018-05-18 NOTE — ED Notes (Signed)
Bed: ZO10 Expected date:  Expected time:  Means of arrival:  Comments: 67 yo weakness, dark stools

## 2018-05-18 NOTE — Brief Op Note (Signed)
05/18/2018  1:51 PM  PATIENT:  Walter Nicholson  67 y.o. male  PRE-OPERATIVE DIAGNOSIS:  Melena  POST-OPERATIVE DIAGNOSIS:  severe erosive esophagitis,  large duodenal ulcer  PROCEDURE:  Procedure(s): ESOPHAGOGASTRODUODENOSCOPY (EGD) WITH PROPOFOL (N/A)  SURGEON:  Surgeon(s) and Role:    * Ronnette Juniper, MD - Primary  PHYSICIAN ASSISTANT:   ASSISTANTS:Lisa Nunn,RN, Minette Brine, Tech ANESTHESIA:   MAC  EBL:  minimal  BLOOD ADMINISTERED:none  DRAINS: none   LOCAL MEDICATIONS USED:  NONE  SPECIMEN:  No Specimen  DISPOSITION OF SPECIMEN:  N/A  COUNTS:  YES  TOURNIQUET:  * No tourniquets in log *  DICTATION: .Dragon Dictation  PLAN OF CARE: Admit to inpatient   PATIENT DISPOSITION:  PACU - guarded condition.   Delay start of Pharmacological VTE agent (>24hrs) due to surgical blood loss or risk of bleeding: yes

## 2018-05-19 ENCOUNTER — Other Ambulatory Visit: Payer: Self-pay

## 2018-05-19 ENCOUNTER — Encounter (HOSPITAL_COMMUNITY): Payer: Self-pay | Admitting: Gastroenterology

## 2018-05-19 DIAGNOSIS — R578 Other shock: Secondary | ICD-10-CM

## 2018-05-19 DIAGNOSIS — E876 Hypokalemia: Secondary | ICD-10-CM

## 2018-05-19 DIAGNOSIS — K264 Chronic or unspecified duodenal ulcer with hemorrhage: Principal | ICD-10-CM

## 2018-05-19 DIAGNOSIS — F314 Bipolar disorder, current episode depressed, severe, without psychotic features: Secondary | ICD-10-CM

## 2018-05-19 DIAGNOSIS — F1010 Alcohol abuse, uncomplicated: Secondary | ICD-10-CM

## 2018-05-19 DIAGNOSIS — R7401 Elevation of levels of liver transaminase levels: Secondary | ICD-10-CM

## 2018-05-19 DIAGNOSIS — R740 Nonspecific elevation of levels of transaminase and lactic acid dehydrogenase [LDH]: Secondary | ICD-10-CM

## 2018-05-19 LAB — CBC
HCT: 23.2 % — ABNORMAL LOW (ref 39.0–52.0)
HEMOGLOBIN: 7.4 g/dL — AB (ref 13.0–17.0)
MCH: 27.7 pg (ref 26.0–34.0)
MCHC: 31.9 g/dL (ref 30.0–36.0)
MCV: 86.9 fL (ref 80.0–100.0)
Platelets: 201 10*3/uL (ref 150–400)
RBC: 2.67 MIL/uL — ABNORMAL LOW (ref 4.22–5.81)
RDW: 17.1 % — AB (ref 11.5–15.5)
WBC: 6.5 10*3/uL (ref 4.0–10.5)
nRBC: 0 % (ref 0.0–0.2)

## 2018-05-19 LAB — COMPREHENSIVE METABOLIC PANEL
ALT: 20 U/L (ref 0–44)
AST: 19 U/L (ref 15–41)
Albumin: 2.6 g/dL — ABNORMAL LOW (ref 3.5–5.0)
Alkaline Phosphatase: 85 U/L (ref 38–126)
Anion gap: 8 (ref 5–15)
BILIRUBIN TOTAL: 0.6 mg/dL (ref 0.3–1.2)
BUN: 12 mg/dL (ref 8–23)
CO2: 24 mmol/L (ref 22–32)
Calcium: 8.2 mg/dL — ABNORMAL LOW (ref 8.9–10.3)
Chloride: 109 mmol/L (ref 98–111)
Creatinine, Ser: 0.63 mg/dL (ref 0.61–1.24)
GFR calc Af Amer: >60 mL/min (ref >60)
Glucose, Bld: 110 mg/dL — ABNORMAL HIGH (ref 70–99)
Potassium: 3.6 mmol/L (ref 3.5–5.1)
Sodium: 141 mmol/L (ref 135–145)
TOTAL PROTEIN: 5.1 g/dL — AB (ref 6.5–8.1)

## 2018-05-19 LAB — HEMOGLOBIN AND HEMATOCRIT, BLOOD
HCT: 21.2 % — ABNORMAL LOW (ref 39.0–52.0)
HCT: 21.8 % — ABNORMAL LOW (ref 39.0–52.0)
HEMATOCRIT: 22.9 % — AB (ref 39.0–52.0)
HEMOGLOBIN: 7.4 g/dL — AB (ref 13.0–17.0)
Hemoglobin: 7.1 g/dL — ABNORMAL LOW (ref 13.0–17.0)
Hemoglobin: 7 g/dL — ABNORMAL LOW (ref 13.0–17.0)

## 2018-05-19 LAB — HIV ANTIBODY (ROUTINE TESTING W REFLEX): HIV SCREEN 4TH GENERATION: NONREACTIVE

## 2018-05-19 MED ORDER — SODIUM CHLORIDE 0.9 % IV SOLN
INTRAVENOUS | Status: AC
  Administered 2018-05-19: 10:00:00 via INTRAVENOUS

## 2018-05-19 NOTE — Progress Notes (Signed)
Referring Physician(s): Dr. Marca Ancona  Supervising Physician: Richarda Overlie  Patient Status:  [facility]  Chief Complaint: Follow-up angiogram and GDA embolization 10/11 by Dr. Archer Asa  Subjective:  67 y/o M with past medical history significant for HTN, bipolar d/o, depression, ETOH abuse who presented to [facility] on 10/11 with complaints of fatigue, lightheadedness, melena, dyspnea with exertion, nausea and epigastric discomfort. He was found to have hemoglobin 7.3 and GI was consulted for GIB. He underwent EGD yesterday who showed esophagitis with coffee-ground fluid in the gastric cavity, mild antral erythema and a large cratered duodenal ulcer with large adherent clot which could not be displaced on lavage. Consult was placed to IR for mesenteric angiogram with prophylactic embolization of GDA which was successfully performed on 10/11 by Dr. Archer Asa.  Patient laying in bed, states he feels awful - c/o pain "all over," unable to pinpoint any specific location. He complains of needing to have BM every 1-2 hours at least, unsure if there has been any blood in BM, one episode of urinary incontinence last night. He complains of nausea without vomiting, generalized abdominal pain no worse than previously but unsure if it has improved. He states he thought he would feel better by now and is discouraged at his perceived lack of progress.  Allergies: Patient has no known allergies.  Medications: Prior to Admission medications   Medication Sig Start Date End Date Taking? Authorizing Provider  acetaminophen (TYLENOL) 500 MG tablet Take 500 mg by mouth every 6 (six) hours as needed for moderate pain.   Yes [provider]  citalopram (CELEXA) 40 MG tablet Take 1 tablet (40 mg total) by mouth daily. For depression 05/01/18  Yes Armandina Stammer I, NP  diclofenac (VOLTAREN) 75 MG EC tablet Take 75 mg by mouth 2 (two) times daily.   Yes [provider]  gabapentin (NEURONTIN) 400 MG  capsule Take 2 capsules (800 mg total) by mouth 3 (three) times daily. For agitation 05/01/18  Yes Armandina Stammer I, NP  lamoTRIgine (LAMICTAL) 200 MG tablet Take 1 tablet (200 mg total) by mouth 2 (two) times daily. For mood stabilization 05/01/18  Yes Nwoko, Nicole Kindred I, NP  levothyroxine (SYNTHROID, LEVOTHROID) 75 MCG tablet Take 1 tablet (75 mcg total) by mouth daily before breakfast. For thyroid hormone replacement 05/02/18  Yes Nwoko, Nicole Kindred I, NP  lisinopril (PRINIVIL,ZESTRIL) 20 MG tablet Take 1 tablet (20 mg total) by mouth daily. For high blood pressure 05/01/18  Yes Nwoko, Agnes I, NP  mirtazapine (REMERON) 30 MG tablet Take 1 tablet (30 mg total) by mouth at bedtime. For depression 05/01/18  Yes Armandina Stammer I, NP  Multiple Vitamins-Minerals (MULTIVITAMIN WITH MINERALS) tablet Take 1 tablet by mouth daily.   Yes [provider]  naproxen sodium (ALEVE) 220 MG tablet Take 440 mg by mouth daily as needed (pain).   Yes [provider]  nicotine (NICODERM CQ - DOSED IN MG/24 HOURS) 21 mg/24hr patch Place 1 patch (21 mg total) onto the skin daily. (May buy from over the counter): For nicotine withdrawal 05/02/18  Yes Armandina Stammer I, NP  pantoprazole (PROTONIX) 40 MG tablet Take 1 tablet (40 mg total) by mouth 2 (two) times daily. For acid reflux 05/01/18  Yes Nwoko, Nicole Kindred I, NP  sucralfate (CARAFATE) 1 g tablet Take 1 tablet (1 g total) by mouth 4 (four) times daily -  with meals and at bedtime. For Ulcer 05/01/18  Yes Armandina Stammer I, NP  traMADol Janean Sark) 50  MG tablet Take 50 mg by mouth 3 (three) times daily as needed for moderate pain.   Yes [provider]  hydrOXYzine (ATARAX/VISTARIL) 50 MG tablet Take 1 tablet (50 mg total) by mouth every 6 (six) hours as needed for anxiety. Patient not taking: Reported on 05/18/2018 05/01/18   Armandina Stammer I, NP     Vital Signs: BP (!) 137/59   Pulse 84   Temp 98.2 F (36.8 C) (Oral)   Resp 12   Ht 5\' 6"  (1.676 m)   Wt 125 lb (56.7  kg)   SpO2 100%   BMI 20.18 kg/m   Physical Exam  Constitutional: No distress.  Tired appearing; laying in bed  HENT:  Head: Normocephalic.  Cardiovascular: Normal rate and regular rhythm.  Pulmonary/Chest: Effort normal and breath sounds normal. No respiratory distress.  Abdominal: Soft. There is tenderness (generalized - mild ).  Neurological: He is alert.  Skin: Skin is warm and dry. He is not diaphoretic.  Right groin access site clean, dry, intact - dressed with gauze. No active bleeding or discharge, scant amount of dried blood on gauze. No pain on palpation, no hematoma or edema.   Psychiatric: He has a normal mood and affect. His behavior is normal.  Nursing note and vitals reviewed.   Imaging: Ir Angiogram Visceral Selective  Result Date: 05/18/2018 EXAM: SELECTIVE VISCERAL ARTERIOGRAPHY; ADDITIONAL ARTERIOGRAPHY; IR EMBO ART VEN HEMORR LYMPH EXTRAV INC GUIDE ROADMAPPING; IR ULTRASOUND GUIDANCE VASC ACCESS RIGHT MEDICATIONS: None ANESTHESIA/SEDATION: Moderate (conscious) sedation was employed during this procedure. A total of Versed 1.5 mg and Fentanyl 50 mcg was administered intravenously. Moderate Sedation Time: 62 minutes. The patient's level of consciousness and vital signs were monitored continuously by radiology nursing throughout the procedure under my direct supervision. CONTRAST:  20mL ISOVUE-300 IOPAMIDOL (ISOVUE-300) INJECTION 61%, 70mL ISOVUE-300 IOPAMIDOL (ISOVUE-300) INJECTION 61% FLUOROSCOPY TIME:  Fluoroscopy Time: 20 minutes 0 seconds (1913 mGy). COMPLICATIONS: None immediate. PROCEDURE: Informed consent was obtained from the patient following explanation of the procedure, risks, benefits and alternatives. The patient understands, agrees and consents for the procedure. All questions were addressed. A time out was performed prior to the initiation of the procedure. Maximal barrier sterile technique utilized including caps, mask, sterile gowns, sterile gloves, large  sterile drape, hand hygiene, and Betadine prep. The right common femoral artery was interrogated with ultrasound and found to be widely patent. An image was obtained and stored for the medical record. Local anesthesia was attained by infiltration with 1% lidocaine. A small dermatotomy was made. Under real-time sonographic guidance, the vessel was punctured with a 21 gauge micropuncture needle. Using standard technique, the initial micro needle was exchanged over a 0.018 micro wire for a transitional 4 [place] micro sheath. The micro sheath was then exchanged over a 0.035 wire for a 5 French vascular sheath. A C2 cobra catheter was advanced over a Bentson wire into the abdominal aorta. Despite multiple attempts, the origin of the celiac artery could not be catheterized. The origin of the superior mesenteric artery was catheterized. Arteriography was performed. The inferior pancreaticoduodenal arcade is hypertrophic and provides robust flow retrograde up the gastroduodenal arteries supplying the right a proper hepatic artery. 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 There is a small vessel stump in the region of the inferior genu of the gastroduodenal artery concerning for and injured/recently bleeding artery. A renegade ST microcatheter was advanced over a Fathom 16 wire into a branch off the superior mesenteric  artery. Arteriography demonstrates anatomy most consistent with the middle colic artery. There is no communication with the gastroduodenal artery. The microcatheter was brought back into the main superior mesenteric artery. The catheter was then successfully navigated into the inferior pancreaticoduodenal artery. Arteriography was performed in multiple obliquities confirming the anatomy of the inferior pancreatic 0 duodenal artery, its anastomosis with the right gastroepiploic artery and right gastroduodenal artery. Again, there is a small beak of an artery consistent with an  injured vessel. This appears to be in the expected location of the descending duodenum. The microcatheter was successfully navigated into the gastroduodenal artery. Contrast extravasation in the region of the abnormal branch artery demonstrates active extravasation. The microcatheter was successfully navigated beyond the injured artery into the gastroduodenal artery more proximal to the proper hepatic artery. Coil embolization was then performed of the distal segment of the gastroduodenal artery across the origin of the injured arterial branch vessel using a series of 3, 4 and 5 mm fibered and non fibered interlock detachable microcoils. Care was taken to make sure the coil pack did not extend across the origin of the right gastroepiploic artery. Post embolization arteriography demonstrates complete cessation of flow within that segment of the gastroduodenal artery, no further extravasation from the injured artery and preservation of flow through the gastroepiploic artery. The microcatheter and 5 French catheters were removed. A limited right common femoral arteriogram was performed confirming common femoral arterial access. Of note, the patient has a high bifurcation. Hemostasis was attained with the assistance of a 5 [place] Cordis ExoSeal extra arterial vascular plug. IMPRESSION: 1. Evidence of arterial injury/active bleeding arising from a branch vessel from the distal gastroduodenal artery. 2. Successful coil embolization of the distal segment of the gastroduodenal artery including the origin of the injured arterial branch. Signed, Sterling Big, MD, RPVI [facility] Electronically Signed   By: Malachy Moan M.D.   On: 05/18/2018 17:17   Ir Angiogram Selective Each Additional Vessel  Result Date: 05/18/2018 EXAM: SELECTIVE VISCERAL ARTERIOGRAPHY; ADDITIONAL ARTERIOGRAPHY; IR EMBO ART VEN HEMORR LYMPH EXTRAV INC GUIDE ROADMAPPING; IR  ULTRASOUND GUIDANCE VASC ACCESS RIGHT MEDICATIONS: None ANESTHESIA/SEDATION: Moderate (conscious) sedation was employed during this procedure. A total of Versed 1.5 mg and Fentanyl 50 mcg was administered intravenously. Moderate Sedation Time: 62 minutes. The patient's level of consciousness and vital signs were monitored continuously by radiology nursing throughout the procedure under my direct supervision. CONTRAST:  20mL ISOVUE-300 IOPAMIDOL (ISOVUE-300) INJECTION 61%, 70mL ISOVUE-300 IOPAMIDOL (ISOVUE-300) INJECTION 61% FLUOROSCOPY TIME:  Fluoroscopy Time: 20 minutes 0 seconds (1913 mGy). COMPLICATIONS: None immediate. PROCEDURE: Informed consent was obtained from the patient following explanation of the procedure, risks, benefits and alternatives. The patient understands, agrees and consents for the procedure. All questions were addressed. A time out was performed prior to the initiation of the procedure. Maximal barrier sterile technique utilized including caps, mask, sterile gowns, sterile gloves, large sterile drape, hand hygiene, and Betadine prep. The right common femoral artery was interrogated with ultrasound and found to be widely patent. An image was obtained and stored for the medical record. Local anesthesia was attained by infiltration with 1% lidocaine. A small dermatotomy was made. Under real-time sonographic guidance, the vessel was punctured with a 21 gauge micropuncture needle. Using standard technique, the initial micro needle was exchanged over a 0.018 micro wire for a transitional 4 [place] micro sheath. The micro sheath was then exchanged over a 0.035 wire for a 5 French vascular sheath. A  C2 cobra catheter was advanced over a Bentson wire into the abdominal aorta. Despite multiple attempts, the origin of the celiac artery could not be catheterized. The origin of the superior mesenteric artery was catheterized. Arteriography was performed. The inferior pancreaticoduodenal arcade is  hypertrophic and provides robust flow retrograde up the gastroduodenal arteries supplying the right a proper hepatic artery. 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 There is a small vessel stump in the region of the inferior genu of the gastroduodenal artery concerning for and injured/recently bleeding artery. A renegade ST microcatheter was advanced over a Fathom 16 wire into a branch off the superior mesenteric artery. Arteriography demonstrates anatomy most consistent with the middle colic artery. There is no communication with the gastroduodenal artery. The microcatheter was brought back into the main superior mesenteric artery. The catheter was then successfully navigated into the inferior pancreaticoduodenal artery. Arteriography was performed in multiple obliquities confirming the anatomy of the inferior pancreatic 0 duodenal artery, its anastomosis with the right gastroepiploic artery and right gastroduodenal artery. Again, there is a small beak of an artery consistent with an injured vessel. This appears to be in the expected location of the descending duodenum. The microcatheter was successfully navigated into the gastroduodenal artery. Contrast extravasation in the region of the abnormal branch artery demonstrates active extravasation. The microcatheter was successfully navigated beyond the injured artery into the gastroduodenal artery more proximal to the proper hepatic artery. Coil embolization was then performed of the distal segment of the gastroduodenal artery across the origin of the injured arterial branch vessel using a series of 3, 4 and 5 mm fibered and non fibered interlock detachable microcoils. Care was taken to make sure the coil pack did not extend across the origin of the right gastroepiploic artery. Post embolization arteriography demonstrates complete cessation of flow within that segment of the gastroduodenal artery, no further extravasation from the  injured artery and preservation of flow through the gastroepiploic artery. The microcatheter and 5 French catheters were removed. A limited right common femoral arteriogram was performed confirming common femoral arterial access. Of note, the patient has a high bifurcation. Hemostasis was attained with the assistance of a 5 [place] Cordis ExoSeal extra arterial vascular plug. IMPRESSION: 1. Evidence of arterial injury/active bleeding arising from a branch vessel from the distal gastroduodenal artery. 2. Successful coil embolization of the distal segment of the gastroduodenal artery including the origin of the injured arterial branch. Signed, Sterling Big, MD, RPVI [facility] Electronically Signed   By: Malachy Moan M.D.   On: 05/18/2018 17:17   Ir Angiogram Selective Each Additional Vessel  Result Date: 05/18/2018 EXAM: SELECTIVE VISCERAL ARTERIOGRAPHY; ADDITIONAL ARTERIOGRAPHY; IR EMBO ART VEN HEMORR LYMPH EXTRAV INC GUIDE ROADMAPPING; IR ULTRASOUND GUIDANCE VASC ACCESS RIGHT MEDICATIONS: None ANESTHESIA/SEDATION: Moderate (conscious) sedation was employed during this procedure. A total of Versed 1.5 mg and Fentanyl 50 mcg was administered intravenously. Moderate Sedation Time: 62 minutes. The patient's level of consciousness and vital signs were monitored continuously by radiology nursing throughout the procedure under my direct supervision. CONTRAST:  20mL ISOVUE-300 IOPAMIDOL (ISOVUE-300) INJECTION 61%, 70mL ISOVUE-300 IOPAMIDOL (ISOVUE-300) INJECTION 61% FLUOROSCOPY TIME:  Fluoroscopy Time: 20 minutes 0 seconds (1913 mGy). COMPLICATIONS: None immediate. PROCEDURE: Informed consent was obtained from the patient following explanation of the procedure, risks, benefits and alternatives. The patient understands, agrees and consents for the procedure. All questions were addressed. A time out was performed prior to the initiation  of the  procedure. Maximal barrier sterile technique utilized including caps, mask, sterile gowns, sterile gloves, large sterile drape, hand hygiene, and Betadine prep. The right common femoral artery was interrogated with ultrasound and found to be widely patent. An image was obtained and stored for the medical record. Local anesthesia was attained by infiltration with 1% lidocaine. A small dermatotomy was made. Under real-time sonographic guidance, the vessel was punctured with a 21 gauge micropuncture needle. Using standard technique, the initial micro needle was exchanged over a 0.018 micro wire for a transitional 4 [place] micro sheath. The micro sheath was then exchanged over a 0.035 wire for a 5 French vascular sheath. A C2 cobra catheter was advanced over a Bentson wire into the abdominal aorta. Despite multiple attempts, the origin of the celiac artery could not be catheterized. The origin of the superior mesenteric artery was catheterized. Arteriography was performed. The inferior pancreaticoduodenal arcade is hypertrophic and provides robust flow retrograde up the gastroduodenal arteries supplying the right a proper hepatic artery. 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 There is a small vessel stump in the region of the inferior genu of the gastroduodenal artery concerning for and injured/recently bleeding artery. A renegade ST microcatheter was advanced over a Fathom 16 wire into a branch off the superior mesenteric artery. Arteriography demonstrates anatomy most consistent with the middle colic artery. There is no communication with the gastroduodenal artery. The microcatheter was brought back into the main superior mesenteric artery. The catheter was then successfully navigated into the inferior pancreaticoduodenal artery. Arteriography was performed in multiple obliquities confirming the anatomy of the inferior pancreatic 0 duodenal artery, its anastomosis with the right  gastroepiploic artery and right gastroduodenal artery. Again, there is a small beak of an artery consistent with an injured vessel. This appears to be in the expected location of the descending duodenum. The microcatheter was successfully navigated into the gastroduodenal artery. Contrast extravasation in the region of the abnormal branch artery demonstrates active extravasation. The microcatheter was successfully navigated beyond the injured artery into the gastroduodenal artery more proximal to the proper hepatic artery. Coil embolization was then performed of the distal segment of the gastroduodenal artery across the origin of the injured arterial branch vessel using a series of 3, 4 and 5 mm fibered and non fibered interlock detachable microcoils. Care was taken to make sure the coil pack did not extend across the origin of the right gastroepiploic artery. Post embolization arteriography demonstrates complete cessation of flow within that segment of the gastroduodenal artery, no further extravasation from the injured artery and preservation of flow through the gastroepiploic artery. The microcatheter and 5 French catheters were removed. A limited right common femoral arteriogram was performed confirming common femoral arterial access. Of note, the patient has a high bifurcation. Hemostasis was attained with the assistance of a 5 [place] Cordis ExoSeal extra arterial vascular plug. IMPRESSION: 1. Evidence of arterial injury/active bleeding arising from a branch vessel from the distal gastroduodenal artery. 2. Successful coil embolization of the distal segment of the gastroduodenal artery including the origin of the injured arterial branch. Signed, Sterling Big, MD, RPVI [facility] Electronically Signed   By: Malachy Moan M.D.   On: 05/18/2018 17:17   Ir Angiogram Selective Each Additional Vessel  Result Date: 05/18/2018 EXAM: SELECTIVE  VISCERAL ARTERIOGRAPHY; ADDITIONAL ARTERIOGRAPHY; IR EMBO ART VEN HEMORR LYMPH EXTRAV INC GUIDE ROADMAPPING; IR ULTRASOUND GUIDANCE VASC ACCESS RIGHT MEDICATIONS: None  ANESTHESIA/SEDATION: Moderate (conscious) sedation was employed during this procedure. A total of Versed 1.5 mg and Fentanyl 50 mcg was administered intravenously. Moderate Sedation Time: 62 minutes. The patient's level of consciousness and vital signs were monitored continuously by radiology nursing throughout the procedure under my direct supervision. CONTRAST:  20mL ISOVUE-300 IOPAMIDOL (ISOVUE-300) INJECTION 61%, 70mL ISOVUE-300 IOPAMIDOL (ISOVUE-300) INJECTION 61% FLUOROSCOPY TIME:  Fluoroscopy Time: 20 minutes 0 seconds (1913 mGy). COMPLICATIONS: None immediate. PROCEDURE: Informed consent was obtained from the patient following explanation of the procedure, risks, benefits and alternatives. The patient understands, agrees and consents for the procedure. All questions were addressed. A time out was performed prior to the initiation of the procedure. Maximal barrier sterile technique utilized including caps, mask, sterile gowns, sterile gloves, large sterile drape, hand hygiene, and Betadine prep. The right common femoral artery was interrogated with ultrasound and found to be widely patent. An image was obtained and stored for the medical record. Local anesthesia was attained by infiltration with 1% lidocaine. A small dermatotomy was made. Under real-time sonographic guidance, the vessel was punctured with a 21 gauge micropuncture needle. Using standard technique, the initial micro needle was exchanged over a 0.018 micro wire for a transitional 4 [place] micro sheath. The micro sheath was then exchanged over a 0.035 wire for a 5 French vascular sheath. A C2 cobra catheter was advanced over a Bentson wire into the abdominal aorta. Despite multiple attempts, the origin of the celiac artery could not be catheterized. The origin of the superior  mesenteric artery was catheterized. Arteriography was performed. The inferior pancreaticoduodenal arcade is hypertrophic and provides robust flow retrograde up the gastroduodenal arteries supplying the right a proper hepatic artery. 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 There is a small vessel stump in the region of the inferior genu of the gastroduodenal artery concerning for and injured/recently bleeding artery. A renegade ST microcatheter was advanced over a Fathom 16 wire into a branch off the superior mesenteric artery. Arteriography demonstrates anatomy most consistent with the middle colic artery. There is no communication with the gastroduodenal artery. The microcatheter was brought back into the main superior mesenteric artery. The catheter was then successfully navigated into the inferior pancreaticoduodenal artery. Arteriography was performed in multiple obliquities confirming the anatomy of the inferior pancreatic 0 duodenal artery, its anastomosis with the right gastroepiploic artery and right gastroduodenal artery. Again, there is a small beak of an artery consistent with an injured vessel. This appears to be in the expected location of the descending duodenum. The microcatheter was successfully navigated into the gastroduodenal artery. Contrast extravasation in the region of the abnormal branch artery demonstrates active extravasation. The microcatheter was successfully navigated beyond the injured artery into the gastroduodenal artery more proximal to the proper hepatic artery. Coil embolization was then performed of the distal segment of the gastroduodenal artery across the origin of the injured arterial branch vessel using a series of 3, 4 and 5 mm fibered and non fibered interlock detachable microcoils. Care was taken to make sure the coil pack did not extend across the origin of the right gastroepiploic artery. Post embolization arteriography demonstrates  complete cessation of flow within that segment of the gastroduodenal artery, no further extravasation from the injured artery and preservation of flow through the gastroepiploic artery. The microcatheter and 5 French catheters were removed. A limited right common femoral arteriogram was performed confirming common femoral arterial access. Of note, the patient has a high bifurcation. Hemostasis was  attained with the assistance of a 5 [place] Cordis ExoSeal extra arterial vascular plug. IMPRESSION: 1. Evidence of arterial injury/active bleeding arising from a branch vessel from the distal gastroduodenal artery. 2. Successful coil embolization of the distal segment of the gastroduodenal artery including the origin of the injured arterial branch. Signed, Sterling Big, MD, RPVI [facility] Radiology Electronically Signed   By: Malachy Moan M.D.   On: 05/18/2018 17:17   Ir US Guide Vasc Access Right  Result Date: 05/18/2018 EXAM: SELECTIVE VISCERAL ARTERIOGRAPHY; ADDITIONAL ARTERIOGRAPHY; IR EMBO ART VEN HEMORR LYMPH EXTRAV INC GUIDE ROADMAPPING; IR ULTRASOUND GUIDANCE VASC ACCESS RIGHT MEDICATIONS: None ANESTHESIA/SEDATION: Moderate (conscious) sedation was employed during this procedure. A total of Versed 1.5 mg and Fentanyl 50 mcg was administered intravenously. Moderate Sedation Time: 62 minutes. The patient's level of consciousness and vital signs were monitored continuously by radiology nursing throughout the procedure under my direct supervision. CONTRAST:  20mL ISOVUE-300 IOPAMIDOL (ISOVUE-300) INJECTION 61%, 70mL ISOVUE-300 IOPAMIDOL (ISOVUE-300) INJECTION 61% FLUOROSCOPY TIME:  Fluoroscopy Time: 20 minutes 0 seconds (1913 mGy). COMPLICATIONS: None immediate. PROCEDURE: Informed consent was obtained from the patient following explanation of the procedure, risks, benefits and alternatives. The patient understands, agrees and consents for the  procedure. All questions were addressed. A time out was performed prior to the initiation of the procedure. Maximal barrier sterile technique utilized including caps, mask, sterile gowns, sterile gloves, large sterile drape, hand hygiene, and Betadine prep. The right common femoral artery was interrogated with ultrasound and found to be widely patent. An image was obtained and stored for the medical record. Local anesthesia was attained by infiltration with 1% lidocaine. A small dermatotomy was made. Under real-time sonographic guidance, the vessel was punctured with a 21 gauge micropuncture needle. Using standard technique, the initial micro needle was exchanged over a 0.018 micro wire for a transitional 4 [place] micro sheath. The micro sheath was then exchanged over a 0.035 wire for a 5 French vascular sheath. A C2 cobra catheter was advanced over a Bentson wire into the abdominal aorta. Despite multiple attempts, the origin of the celiac artery could not be catheterized. The origin of the superior mesenteric artery was catheterized. Arteriography was performed. The inferior pancreaticoduodenal arcade is hypertrophic and provides robust flow retrograde up the gastroduodenal arteries supplying the right a proper hepatic artery. 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 There is a small vessel stump in the region of the inferior genu of the gastroduodenal artery concerning for and injured/recently bleeding artery. A renegade ST microcatheter was advanced over a Fathom 16 wire into a branch off the superior mesenteric artery. Arteriography demonstrates anatomy most consistent with the middle colic artery. There is no communication with the gastroduodenal artery. The microcatheter was brought back into the main superior mesenteric artery. The catheter was then successfully navigated into the inferior pancreaticoduodenal artery. Arteriography was performed in multiple obliquities  confirming the anatomy of the inferior pancreatic 0 duodenal artery, its anastomosis with the right gastroepiploic artery and right gastroduodenal artery. Again, there is a small beak of an artery consistent with an injured vessel. This appears to be in the expected location of the descending duodenum. The microcatheter was successfully navigated into the gastroduodenal artery. Contrast extravasation in the region of the abnormal branch artery demonstrates active extravasation. The microcatheter was successfully navigated beyond the injured artery into the gastroduodenal artery more proximal to the proper hepatic artery. Coil embolization was then performed  of the distal segment of the gastroduodenal artery across the origin of the injured arterial branch vessel using a series of 3, 4 and 5 mm fibered and non fibered interlock detachable microcoils. Care was taken to make sure the coil pack did not extend across the origin of the right gastroepiploic artery. Post embolization arteriography demonstrates complete cessation of flow within that segment of the gastroduodenal artery, no further extravasation from the injured artery and preservation of flow through the gastroepiploic artery. The microcatheter and 5 French catheters were removed. A limited right common femoral arteriogram was performed confirming common femoral arterial access. Of note, the patient has a high bifurcation. Hemostasis was attained with the assistance of a 5 [place] Cordis ExoSeal extra arterial vascular plug. IMPRESSION: 1. Evidence of arterial injury/active bleeding arising from a branch vessel from the distal gastroduodenal artery. 2. Successful coil embolization of the distal segment of the gastroduodenal artery including the origin of the injured arterial branch. Signed, Sterling Big, MD, RPVI [facility] Radiology Electronically Signed   By: Malachy Moan M.D.   On: 05/18/2018  17:17   Dg Chest Port 1 View  Result Date: 05/18/2018 CLINICAL DATA:  Shortness of breath, weakness, stumbling, abdominal pain for 5 days, dark-colored stools, history of peptic ulcers, smoker EXAM: PORTABLE CHEST 1 VIEW COMPARISON:  Portable exam 0846 hours compared 04/24/2018 FINDINGS: Minimal enlargement of cardiac silhouette. Atherosclerotic calcification aorta. Mediastinal contours and pulmonary vascularity normal. Minimal subsegmental atelectasis at LEFT costophrenic angle. Lungs otherwise clear. No infiltrate, pleural effusion or pneumothorax. Bones demineralized. IMPRESSION: Minimal LEFT basilar subsegmental atelectasis. Electronically Signed   By: Ulyses Southward M.D.   On: 05/18/2018 09:22   Ir Embo Art  Peter Minium Hem[facility] [employer] Guide Roadmapping  Result Date: 05/18/2018 EXAM: SELECTIVE VISCERAL ARTERIOGRAPHY; ADDITIONAL ARTERIOGRAPHY; IR EMBO ART VEN HEMORR LYMPH EXTRAV INC GUIDE ROADMAPPING; IR ULTRASOUND GUIDANCE VASC ACCESS RIGHT MEDICATIONS: None ANESTHESIA/SEDATION: Moderate (conscious) sedation was employed during this procedure. A total of Versed 1.5 mg and Fentanyl 50 mcg was administered intravenously. Moderate Sedation Time: 62 minutes. The patient's level of consciousness and vital signs were monitored continuously by radiology nursing throughout the procedure under my direct supervision. CONTRAST:  20mL ISOVUE-300 IOPAMIDOL (ISOVUE-300) INJECTION 61%, 70mL ISOVUE-300 IOPAMIDOL (ISOVUE-300) INJECTION 61% FLUOROSCOPY TIME:  Fluoroscopy Time: 20 minutes 0 seconds (1913 mGy). COMPLICATIONS: None immediate. PROCEDURE: Informed consent was obtained from the patient following explanation of the procedure, risks, benefits and alternatives. The patient understands, agrees and consents for the procedure. All questions were addressed. A time out was performed prior to the initiation of the procedure. Maximal barrier sterile technique utilized including caps, mask, sterile gowns, sterile gloves,  large sterile drape, hand hygiene, and Betadine prep. The right common femoral artery was interrogated with ultrasound and found to be widely patent. An image was obtained and stored for the medical record. Local anesthesia was attained by infiltration with 1% lidocaine. A small dermatotomy was made. Under real-time sonographic guidance, the vessel was punctured with a 21 gauge micropuncture needle. Using standard technique, the initial micro needle was exchanged over a 0.018 micro wire for a transitional 4 [place] micro sheath. The micro sheath was then exchanged over a 0.035 wire for a 5 French vascular sheath. A C2 cobra catheter was advanced over a Bentson wire into the abdominal aorta. Despite multiple attempts, the origin of the celiac artery could not be catheterized. The origin of the superior mesenteric artery was catheterized. Arteriography was performed.  The inferior pancreaticoduodenal arcade is hypertrophic and provides robust flow retrograde up the gastroduodenal arteries supplying the right a proper hepatic artery. 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 There is a small vessel stump in the region of the inferior genu of the gastroduodenal artery concerning for and injured/recently bleeding artery. A renegade ST microcatheter was advanced over a Fathom 16 wire into a branch off the superior mesenteric artery. Arteriography demonstrates anatomy most consistent with the middle colic artery. There is no communication with the gastroduodenal artery. The microcatheter was brought back into the main superior mesenteric artery. The catheter was then successfully navigated into the inferior pancreaticoduodenal artery. Arteriography was performed in multiple obliquities confirming the anatomy of the inferior pancreatic 0 duodenal artery, its anastomosis with the right gastroepiploic artery and right gastroduodenal artery. Again, there is a small beak of an artery consistent with  an injured vessel. This appears to be in the expected location of the descending duodenum. The microcatheter was successfully navigated into the gastroduodenal artery. Contrast extravasation in the region of the abnormal branch artery demonstrates active extravasation. The microcatheter was successfully navigated beyond the injured artery into the gastroduodenal artery more proximal to the proper hepatic artery. Coil embolization was then performed of the distal segment of the gastroduodenal artery across the origin of the injured arterial branch vessel using a series of 3, 4 and 5 mm fibered and non fibered interlock detachable microcoils. Care was taken to make sure the coil pack did not extend across the origin of the right gastroepiploic artery. Post embolization arteriography demonstrates complete cessation of flow within that segment of the gastroduodenal artery, no further extravasation from the injured artery and preservation of flow through the gastroepiploic artery. The microcatheter and 5 French catheters were removed. A limited right common femoral arteriogram was performed confirming common femoral arterial access. Of note, the patient has a high bifurcation. Hemostasis was attained with the assistance of a 5 [place] Cordis ExoSeal extra arterial vascular plug. IMPRESSION: 1. Evidence of arterial injury/active bleeding arising from a branch vessel from the distal gastroduodenal artery. 2. Successful coil embolization of the distal segment of the gastroduodenal artery including the origin of the injured arterial branch. Signed, Sterling Big, MD, RPVI [facility] Radiology Electronically Signed   By: Malachy Moan M.D.   On: 05/18/2018 17:17    Labs:  CBC: Recent Labs    04/29/18 1828 05/02/18 1822 05/18/18 0827 05/18/18 1715 05/18/18 2235 05/19/18 0344  WBC 6.5 7.7 8.2  --   --  6.5  HGB 10.3* 10.7* 7.3* 6.9* 7.0* 7.4*  HCT 33.0*  33.4* 23.8* 22.5* 22.4* 23.2*  PLT 348 423* 341  --   --  201    COAGS: Recent Labs    05/18/18 0827  INR 0.97    BMP: Recent Labs    04/22/18 0914 05/02/18 1822 05/18/18 0827 05/19/18 0344  NA 135 142 142 141  K 3.8 3.4* 2.9* 3.6  CL 98 104 104 109  CO2 25 27 28 24   GLUCOSE 103* 97 111* 110*  BUN 8 15 18 12   CALCIUM 9.4 9.7 8.7* 8.2*  CREATININE 0.81 0.85 0.94 0.63  GFRNONAA >60 >60 >60 >60  GFRAA >60 >60 >60 >60    LIVER FUNCTION TESTS: Recent Labs    04/22/18 0914 05/02/18 1822 05/18/18 0827 05/19/18 0344  BILITOT 0.5 0.5 0.6 0.6  AST 20 16 47* 19  ALT 12 12  34 20  ALKPHOS 73 70 137* 85  PROT 7.6 7.5 6.5 5.1*  ALBUMIN 4.4 4.2 3.4* 2.6*    Assessment and Plan:  S/p GDA embolization for GIB yesterday by Dr. Archer Asa - patient reports continued abdominal pain and frequent BMs, he is unsure if there was been blood in his BMs however per chart appears to continue to have melanotic stools. Tolerating clear liquids. Afebrile, slightly hypertensive but vital signs otherwise stable, H/H improving now 7.4/23.2. Continues on IV octreotide and Protonix - per hospitalist plan to start IVF today and continue to trend hemoglobin.  Please call IR with questions or concerns.  Electronically Signed: Villa Herb, PA-C 05/19/2018, 8:55 AM   I spent a total of 15 Minutes at the the patient's bedside AND on the patient's [facility] floor or unit, greater than 50% of which was counseling/coordinating care for GDA embolization.

## 2018-05-19 NOTE — Progress Notes (Signed)
Subjective: Crampy discomfort. Some residual black diarrhea.  Objective: Vital signs in last 24 hours: Temp:  [98.2 F (36.8 C)-99.2 F (37.3 C)] 98.2 F (36.8 C) (10/12 0729) Pulse Rate:  [65-84] 77 (10/12 1000) Resp:  [8-29] 29 (10/12 1000) BP: (104-183)/(39-94) 183/94 (10/12 1000) SpO2:  [96 %-100 %] 100 % (10/12 1000) Weight:  [56.7 kg] 56.7 kg (10/11 1323) Weight change:  Last BM Date: 05/19/18  PE: GEN:  NAD, cachectic, older-appearing than stated age ABD:  Mild generalized tenderness, no peritonitis  Lab Results: CBC    Component Value Date/Time   WBC 6.5 05/19/2018 0344   RBC 2.67 (L) 05/19/2018 0344   HGB 7.4 (L) 05/19/2018 1052   HCT 22.9 (L) 05/19/2018 1052   PLT 201 05/19/2018 0344   MCV 86.9 05/19/2018 0344   MCH 27.7 05/19/2018 0344   MCHC 31.9 05/19/2018 0344   RDW 17.1 (H) 05/19/2018 0344   LYMPHSABS 0.9 05/18/2018 0827   MONOABS 0.8 05/18/2018 0827   EOSABS 0.1 05/18/2018 0827   BASOSABS 0.0 05/18/2018 0827   CMP     Component Value Date/Time   NA 141 05/19/2018 0344   K 3.6 05/19/2018 0344   CL 109 05/19/2018 0344   CO2 24 05/19/2018 0344   GLUCOSE 110 (H) 05/19/2018 0344   BUN 12 05/19/2018 0344   CREATININE 0.63 05/19/2018 0344   CALCIUM 8.2 (L) 05/19/2018 0344   PROT 5.1 (L) 05/19/2018 0344   ALBUMIN 2.6 (L) 05/19/2018 0344   AST 19 05/19/2018 0344   ALT 20 05/19/2018 0344   ALKPHOS 85 05/19/2018 0344   BILITOT 0.6 05/19/2018 0344   GFRNONAA >60 05/19/2018 0344   GFRAA >60 05/19/2018 0344   Assessment:  1.  Melena, improving. 2.  Anemia, acute blood loss, persistent. 3.  Duodenal ulcer, large with large overlying clot, no endoscopic therapy possible, GDA embolization by Interventional Radiology done yesterday.  Plan:  1.  Hopefully the dark stools are more equilibrative changes.  No frank destabilizing hematochezia is seen. 2.  Continue PPI and clear liquid diet for now. 3.  If ongoing bleeding, and anemia, and transfusion  needs over the next couple days, would likely need surgical consultation.  The large ulcer and overlying clot/vessel are too large and prohibitive for any further attempts at endoscopic hemostasis. 4.  [facility] will follow.   Walter [facility] 05/19/2018, 11:35 AM   Cell [phone] If no answer or after 5 PM call [phone]

## 2018-05-19 NOTE — Progress Notes (Signed)
[facility] PROGRESS NOTE    Progress Note  Walter Nicholson  AVW:[id number] DOB: Sep 27, 1950 DOA: 05/18/2018 PCP: Patient, No Pcp Per     Brief Narrative:   Walter Nicholson is an 67 y.o. male past medical history of depression bipolar disorder alcohol abuse presents with fatigue lightheadedness and black stool, he has been taking Aleve and Voltaren for the last week  Assessment/Plan:   Duodenal ulcer with hemorrhage/GI bleed/Hemorrhagic shock (HCC) Hemoglobin of September was 10.7 on admission is 7.3. He was started on IV Protonix he was transfused 2 units of packed red blood cells, his hemoglobin now is 7.3 He was started on IV octreotide. GI was consulted who recommended an EGD 05/19/2011 that showed severe erosive esophagitis with large duodenal ulcer with visible vessel. IR was consulted for embolization on GDA on 05/18/2018. He relates he does not feel well, has had multiple melanotic stools. We will start him on IV fluids continue to trend hemoglobin  Essential hypertension: Continue to hold antihypertensive medication.  Mild elevation  Transaminitis: Resolved.  Bipolar I disorder, severe, current or most recent episode depressed, with melancholic features (HCC) GI to dictate when we can start his medication.  History of alcohol abuse He relates his last drink was more than 2 months ago no signs of withdrawal.  Hypokalemia: Resolved  DVT prophylaxis: scd Family Communication:none Disposition Plan/Barrier to D/C: keep in SDU Code Status:     Code Status Orders  (From admission, onward)         Start     Ordered   05/18/18 1257  Full code  Continuous     05/18/18 1256        Code Status History    Date Active Date Inactive Code Status Order ID Comments User Context   05/02/2018 1949 05/03/2018 1240 Full Code [id number]  Walter Barrette, MD ED   04/24/2018 1737 05/01/2018 1822 Full Code [id number]  Walter Hayward, FNP Inpatient   04/24/2018 1737  04/24/2018 1737 Full Code [id number]  Walter Hayward, FNP Inpatient   04/22/2018 0958 04/24/2018 1705 Full Code [id number]  Walter Bucco, MD ED        IV Access:    Peripheral IV   Procedures and diagnostic studies:   Ir Angiogram Visceral Selective  Result Date: 05/18/2018 EXAM: SELECTIVE VISCERAL ARTERIOGRAPHY; ADDITIONAL ARTERIOGRAPHY; IR EMBO ART VEN HEMORR LYMPH EXTRAV INC GUIDE ROADMAPPING; IR ULTRASOUND GUIDANCE VASC ACCESS RIGHT MEDICATIONS: None ANESTHESIA/SEDATION: Moderate (conscious) sedation was employed during this procedure. A total of Versed 1.5 mg and Fentanyl 50 mcg was administered intravenously. Moderate Sedation Time: 62 minutes. The patient's level of consciousness and vital signs were monitored continuously by radiology nursing throughout the procedure under my direct supervision. CONTRAST:  20mL ISOVUE-300 IOPAMIDOL (ISOVUE-300) INJECTION 61%, 70mL ISOVUE-300 IOPAMIDOL (ISOVUE-300) INJECTION 61% FLUOROSCOPY TIME:  Fluoroscopy Time: 20 minutes 0 seconds (1913 mGy). COMPLICATIONS: None immediate. PROCEDURE: Informed consent was obtained from the patient following explanation of the procedure, risks, benefits and alternatives. The patient understands, agrees and consents for the procedure. All questions were addressed. A time out was performed prior to the initiation of the procedure. Maximal barrier sterile technique utilized including caps, mask, sterile gowns, sterile gloves, large sterile drape, hand hygiene, and Betadine prep. The right common femoral artery was interrogated with ultrasound and found to be widely patent. An image was obtained and stored for the medical record. Local anesthesia was attained by infiltration with 1% lidocaine. A small dermatotomy was made. Under real-time sonographic guidance,  the vessel was punctured with a 21 gauge micropuncture needle. Using standard technique, the initial micro needle was exchanged over a 0.018 micro wire for a  transitional 4 [place] micro sheath. The micro sheath was then exchanged over a 0.035 wire for a 5 French vascular sheath. A C2 cobra catheter was advanced over a Bentson wire into the abdominal aorta. Despite multiple attempts, the origin of the celiac artery could not be catheterized. The origin of the superior mesenteric artery was catheterized. Arteriography was performed. The inferior pancreaticoduodenal arcade is hypertrophic and provides robust flow retrograde up the gastroduodenal arteries supplying the right a proper hepatic artery. 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 There is a small vessel stump in the region of the inferior genu of the gastroduodenal artery concerning for and injured/recently bleeding artery. A renegade ST microcatheter was advanced over a Fathom 16 wire into a branch off the superior mesenteric artery. Arteriography demonstrates anatomy most consistent with the middle colic artery. There is no communication with the gastroduodenal artery. The microcatheter was brought back into the main superior mesenteric artery. The catheter was then successfully navigated into the inferior pancreaticoduodenal artery. Arteriography was performed in multiple obliquities confirming the anatomy of the inferior pancreatic 0 duodenal artery, its anastomosis with the right gastroepiploic artery and right gastroduodenal artery. Again, there is a small beak of an artery consistent with an injured vessel. This appears to be in the expected location of the descending duodenum. The microcatheter was successfully navigated into the gastroduodenal artery. Contrast extravasation in the region of the abnormal branch artery demonstrates active extravasation. The microcatheter was successfully navigated beyond the injured artery into the gastroduodenal artery more proximal to the proper hepatic artery. Coil embolization was then performed of the distal segment of the gastroduodenal  artery across the origin of the injured arterial branch vessel using a series of 3, 4 and 5 mm fibered and non fibered interlock detachable microcoils. Care was taken to make sure the coil pack did not extend across the origin of the right gastroepiploic artery. Post embolization arteriography demonstrates complete cessation of flow within that segment of the gastroduodenal artery, no further extravasation from the injured artery and preservation of flow through the gastroepiploic artery. The microcatheter and 5 French catheters were removed. A limited right common femoral arteriogram was performed confirming common femoral arterial access. Of note, the patient has a high bifurcation. Hemostasis was attained with the assistance of a 5 [place] Cordis ExoSeal extra arterial vascular plug. IMPRESSION: 1. Evidence of arterial injury/active bleeding arising from a branch vessel from the distal gastroduodenal artery. 2. Successful coil embolization of the distal segment of the gastroduodenal artery including the origin of the injured arterial branch. Signed, Sterling Big, MD, RPVI [facility] Radiology Electronically Signed   By: Malachy Moan M.D.   On: 05/18/2018 17:17   Ir Angiogram Selective Each Additional Vessel  Result Date: 05/18/2018 EXAM: SELECTIVE VISCERAL ARTERIOGRAPHY; ADDITIONAL ARTERIOGRAPHY; IR EMBO ART VEN HEMORR LYMPH EXTRAV INC GUIDE ROADMAPPING; IR ULTRASOUND GUIDANCE VASC ACCESS RIGHT MEDICATIONS: None ANESTHESIA/SEDATION: Moderate (conscious) sedation was employed during this procedure. A total of Versed 1.5 mg and Fentanyl 50 mcg was administered intravenously. Moderate Sedation Time: 62 minutes. The patient's level of consciousness and vital signs were monitored continuously by radiology nursing throughout the procedure under my direct supervision. CONTRAST:  20mL ISOVUE-300 IOPAMIDOL (ISOVUE-300) INJECTION 61%, 70mL ISOVUE-300  IOPAMIDOL (ISOVUE-300) INJECTION 61% FLUOROSCOPY TIME:  Fluoroscopy  Time: 20 minutes 0 seconds (1913 mGy). COMPLICATIONS: None immediate. PROCEDURE: Informed consent was obtained from the patient following explanation of the procedure, risks, benefits and alternatives. The patient understands, agrees and consents for the procedure. All questions were addressed. A time out was performed prior to the initiation of the procedure. Maximal barrier sterile technique utilized including caps, mask, sterile gowns, sterile gloves, large sterile drape, hand hygiene, and Betadine prep. The right common femoral artery was interrogated with ultrasound and found to be widely patent. An image was obtained and stored for the medical record. Local anesthesia was attained by infiltration with 1% lidocaine. A small dermatotomy was made. Under real-time sonographic guidance, the vessel was punctured with a 21 gauge micropuncture needle. Using standard technique, the initial micro needle was exchanged over a 0.018 micro wire for a transitional 4 [place] micro sheath. The micro sheath was then exchanged over a 0.035 wire for a 5 French vascular sheath. A C2 cobra catheter was advanced over a Bentson wire into the abdominal aorta. Despite multiple attempts, the origin of the celiac artery could not be catheterized. The origin of the superior mesenteric artery was catheterized. Arteriography was performed. The inferior pancreaticoduodenal arcade is hypertrophic and provides robust flow retrograde up the gastroduodenal arteries supplying the right a proper hepatic artery. 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 There is a small vessel stump in the region of the inferior genu of the gastroduodenal artery concerning for and injured/recently bleeding artery. A renegade ST microcatheter was advanced over a Fathom 16 wire into a branch off the superior mesenteric artery. Arteriography demonstrates anatomy most  consistent with the middle colic artery. There is no communication with the gastroduodenal artery. The microcatheter was brought back into the main superior mesenteric artery. The catheter was then successfully navigated into the inferior pancreaticoduodenal artery. Arteriography was performed in multiple obliquities confirming the anatomy of the inferior pancreatic 0 duodenal artery, its anastomosis with the right gastroepiploic artery and right gastroduodenal artery. Again, there is a small beak of an artery consistent with an injured vessel. This appears to be in the expected location of the descending duodenum. The microcatheter was successfully navigated into the gastroduodenal artery. Contrast extravasation in the region of the abnormal branch artery demonstrates active extravasation. The microcatheter was successfully navigated beyond the injured artery into the gastroduodenal artery more proximal to the proper hepatic artery. Coil embolization was then performed of the distal segment of the gastroduodenal artery across the origin of the injured arterial branch vessel using a series of 3, 4 and 5 mm fibered and non fibered interlock detachable microcoils. Care was taken to make sure the coil pack did not extend across the origin of the right gastroepiploic artery. Post embolization arteriography demonstrates complete cessation of flow within that segment of the gastroduodenal artery, no further extravasation from the injured artery and preservation of flow through the gastroepiploic artery. The microcatheter and 5 French catheters were removed. A limited right common femoral arteriogram was performed confirming common femoral arterial access. Of note, the patient has a high bifurcation. Hemostasis was attained with the assistance of a 5 [place] Cordis ExoSeal extra arterial vascular plug. IMPRESSION: 1. Evidence of arterial injury/active bleeding arising from a branch vessel from the distal gastroduodenal  artery. 2. Successful coil embolization of the distal segment of the gastroduodenal artery including the origin of the injured arterial branch. Signed, Sterling Big, MD, RPVI [facility] Radiology Electronically Signed  By: Malachy Moan M.D.   On: 05/18/2018 17:17   Ir Angiogram Selective Each Additional Vessel  Result Date: 05/18/2018 EXAM: SELECTIVE VISCERAL ARTERIOGRAPHY; ADDITIONAL ARTERIOGRAPHY; IR EMBO ART VEN HEMORR LYMPH EXTRAV INC GUIDE ROADMAPPING; IR ULTRASOUND GUIDANCE VASC ACCESS RIGHT MEDICATIONS: None ANESTHESIA/SEDATION: Moderate (conscious) sedation was employed during this procedure. A total of Versed 1.5 mg and Fentanyl 50 mcg was administered intravenously. Moderate Sedation Time: 62 minutes. The patient's level of consciousness and vital signs were monitored continuously by radiology nursing throughout the procedure under my direct supervision. CONTRAST:  20mL ISOVUE-300 IOPAMIDOL (ISOVUE-300) INJECTION 61%, 70mL ISOVUE-300 IOPAMIDOL (ISOVUE-300) INJECTION 61% FLUOROSCOPY TIME:  Fluoroscopy Time: 20 minutes 0 seconds (1913 mGy). COMPLICATIONS: None immediate. PROCEDURE: Informed consent was obtained from the patient following explanation of the procedure, risks, benefits and alternatives. The patient understands, agrees and consents for the procedure. All questions were addressed. A time out was performed prior to the initiation of the procedure. Maximal barrier sterile technique utilized including caps, mask, sterile gowns, sterile gloves, large sterile drape, hand hygiene, and Betadine prep. The right common femoral artery was interrogated with ultrasound and found to be widely patent. An image was obtained and stored for the medical record. Local anesthesia was attained by infiltration with 1% lidocaine. A small dermatotomy was made. Under real-time sonographic guidance, the vessel was punctured with a 21 gauge micropuncture  needle. Using standard technique, the initial micro needle was exchanged over a 0.018 micro wire for a transitional 4 [place] micro sheath. The micro sheath was then exchanged over a 0.035 wire for a 5 French vascular sheath. A C2 cobra catheter was advanced over a Bentson wire into the abdominal aorta. Despite multiple attempts, the origin of the celiac artery could not be catheterized. The origin of the superior mesenteric artery was catheterized. Arteriography was performed. The inferior pancreaticoduodenal arcade is hypertrophic and provides robust flow retrograde up the gastroduodenal arteries supplying the right a proper hepatic artery. 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 There is a small vessel stump in the region of the inferior genu of the gastroduodenal artery concerning for and injured/recently bleeding artery. A renegade ST microcatheter was advanced over a Fathom 16 wire into a branch off the superior mesenteric artery. Arteriography demonstrates anatomy most consistent with the middle colic artery. There is no communication with the gastroduodenal artery. The microcatheter was brought back into the main superior mesenteric artery. The catheter was then successfully navigated into the inferior pancreaticoduodenal artery. Arteriography was performed in multiple obliquities confirming the anatomy of the inferior pancreatic 0 duodenal artery, its anastomosis with the right gastroepiploic artery and right gastroduodenal artery. Again, there is a small beak of an artery consistent with an injured vessel. This appears to be in the expected location of the descending duodenum. The microcatheter was successfully navigated into the gastroduodenal artery. Contrast extravasation in the region of the abnormal branch artery demonstrates active extravasation. The microcatheter was successfully navigated beyond the injured artery into the gastroduodenal artery more proximal to the  proper hepatic artery. Coil embolization was then performed of the distal segment of the gastroduodenal artery across the origin of the injured arterial branch vessel using a series of 3, 4 and 5 mm fibered and non fibered interlock detachable microcoils. Care was taken to make sure the coil pack did not extend across the origin of the right gastroepiploic artery. Post embolization arteriography demonstrates complete cessation of flow within that segment of the gastroduodenal artery,  no further extravasation from the injured artery and preservation of flow through the gastroepiploic artery. The microcatheter and 5 French catheters were removed. A limited right common femoral arteriogram was performed confirming common femoral arterial access. Of note, the patient has a high bifurcation. Hemostasis was attained with the assistance of a 5 [place] Cordis ExoSeal extra arterial vascular plug. IMPRESSION: 1. Evidence of arterial injury/active bleeding arising from a branch vessel from the distal gastroduodenal artery. 2. Successful coil embolization of the distal segment of the gastroduodenal artery including the origin of the injured arterial branch. Signed, Sterling Big, MD, RPVI [facility] Radiology Electronically Signed   By: Malachy Moan M.D.   On: 05/18/2018 17:17   Ir Angiogram Selective Each Additional Vessel  Result Date: 05/18/2018 EXAM: SELECTIVE VISCERAL ARTERIOGRAPHY; ADDITIONAL ARTERIOGRAPHY; IR EMBO ART VEN HEMORR LYMPH EXTRAV INC GUIDE ROADMAPPING; IR ULTRASOUND GUIDANCE VASC ACCESS RIGHT MEDICATIONS: None ANESTHESIA/SEDATION: Moderate (conscious) sedation was employed during this procedure. A total of Versed 1.5 mg and Fentanyl 50 mcg was administered intravenously. Moderate Sedation Time: 62 minutes. The patient's level of consciousness and vital signs were monitored continuously by radiology nursing throughout the procedure under my  direct supervision. CONTRAST:  20mL ISOVUE-300 IOPAMIDOL (ISOVUE-300) INJECTION 61%, 70mL ISOVUE-300 IOPAMIDOL (ISOVUE-300) INJECTION 61% FLUOROSCOPY TIME:  Fluoroscopy Time: 20 minutes 0 seconds (1913 mGy). COMPLICATIONS: None immediate. PROCEDURE: Informed consent was obtained from the patient following explanation of the procedure, risks, benefits and alternatives. The patient understands, agrees and consents for the procedure. All questions were addressed. A time out was performed prior to the initiation of the procedure. Maximal barrier sterile technique utilized including caps, mask, sterile gowns, sterile gloves, large sterile drape, hand hygiene, and Betadine prep. The right common femoral artery was interrogated with ultrasound and found to be widely patent. An image was obtained and stored for the medical record. Local anesthesia was attained by infiltration with 1% lidocaine. A small dermatotomy was made. Under real-time sonographic guidance, the vessel was punctured with a 21 gauge micropuncture needle. Using standard technique, the initial micro needle was exchanged over a 0.018 micro wire for a transitional 4 [place] micro sheath. The micro sheath was then exchanged over a 0.035 wire for a 5 French vascular sheath. A C2 cobra catheter was advanced over a Bentson wire into the abdominal aorta. Despite multiple attempts, the origin of the celiac artery could not be catheterized. The origin of the superior mesenteric artery was catheterized. Arteriography was performed. The inferior pancreaticoduodenal arcade is hypertrophic and provides robust flow retrograde up the gastroduodenal arteries supplying the right a proper hepatic artery. 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 There is a small vessel stump in the region of the inferior genu of the gastroduodenal artery concerning for and injured/recently bleeding artery. A renegade ST microcatheter was advanced over a Fathom 16  wire into a branch off the superior mesenteric artery. Arteriography demonstrates anatomy most consistent with the middle colic artery. There is no communication with the gastroduodenal artery. The microcatheter was brought back into the main superior mesenteric artery. The catheter was then successfully navigated into the inferior pancreaticoduodenal artery. Arteriography was performed in multiple obliquities confirming the anatomy of the inferior pancreatic 0 duodenal artery, its anastomosis with the right gastroepiploic artery and right gastroduodenal artery. Again, there is a small beak of an artery consistent with an injured vessel. This appears to be in the expected location of the descending duodenum. The  microcatheter was successfully navigated into the gastroduodenal artery. Contrast extravasation in the region of the abnormal branch artery demonstrates active extravasation. The microcatheter was successfully navigated beyond the injured artery into the gastroduodenal artery more proximal to the proper hepatic artery. Coil embolization was then performed of the distal segment of the gastroduodenal artery across the origin of the injured arterial branch vessel using a series of 3, 4 and 5 mm fibered and non fibered interlock detachable microcoils. Care was taken to make sure the coil pack did not extend across the origin of the right gastroepiploic artery. Post embolization arteriography demonstrates complete cessation of flow within that segment of the gastroduodenal artery, no further extravasation from the injured artery and preservation of flow through the gastroepiploic artery. The microcatheter and 5 French catheters were removed. A limited right common femoral arteriogram was performed confirming common femoral arterial access. Of note, the patient has a high bifurcation. Hemostasis was attained with the assistance of a 5 [place] Cordis ExoSeal extra arterial vascular plug. IMPRESSION: 1. Evidence of  arterial injury/active bleeding arising from a branch vessel from the distal gastroduodenal artery. 2. Successful coil embolization of the distal segment of the gastroduodenal artery including the origin of the injured arterial branch. Signed, Sterling Big, MD, RPVI [facility] Radiology Electronically Signed   By: Malachy Moan M.D.   On: 05/18/2018 17:17   Ir US Guide Vasc Access Right  Result Date: 05/18/2018 EXAM: SELECTIVE VISCERAL ARTERIOGRAPHY; ADDITIONAL ARTERIOGRAPHY; IR EMBO ART VEN HEMORR LYMPH EXTRAV INC GUIDE ROADMAPPING; IR ULTRASOUND GUIDANCE VASC ACCESS RIGHT MEDICATIONS: None ANESTHESIA/SEDATION: Moderate (conscious) sedation was employed during this procedure. A total of Versed 1.5 mg and Fentanyl 50 mcg was administered intravenously. Moderate Sedation Time: 62 minutes. The patient's level of consciousness and vital signs were monitored continuously by radiology nursing throughout the procedure under my direct supervision. CONTRAST:  20mL ISOVUE-300 IOPAMIDOL (ISOVUE-300) INJECTION 61%, 70mL ISOVUE-300 IOPAMIDOL (ISOVUE-300) INJECTION 61% FLUOROSCOPY TIME:  Fluoroscopy Time: 20 minutes 0 seconds (1913 mGy). COMPLICATIONS: None immediate. PROCEDURE: Informed consent was obtained from the patient following explanation of the procedure, risks, benefits and alternatives. The patient understands, agrees and consents for the procedure. All questions were addressed. A time out was performed prior to the initiation of the procedure. Maximal barrier sterile technique utilized including caps, mask, sterile gowns, sterile gloves, large sterile drape, hand hygiene, and Betadine prep. The right common femoral artery was interrogated with ultrasound and found to be widely patent. An image was obtained and stored for the medical record. Local anesthesia was attained by infiltration with 1% lidocaine. A small dermatotomy was made. Under real-time  sonographic guidance, the vessel was punctured with a 21 gauge micropuncture needle. Using standard technique, the initial micro needle was exchanged over a 0.018 micro wire for a transitional 4 [place] micro sheath. The micro sheath was then exchanged over a 0.035 wire for a 5 French vascular sheath. A C2 cobra catheter was advanced over a Bentson wire into the abdominal aorta. Despite multiple attempts, the origin of the celiac artery could not be catheterized. The origin of the superior mesenteric artery was catheterized. Arteriography was performed. The inferior pancreaticoduodenal arcade is hypertrophic and provides robust flow retrograde up the gastroduodenal arteries supplying the right a proper hepatic artery. 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 There is a small vessel stump in the region of the inferior genu of the gastroduodenal artery concerning for and injured/recently bleeding  artery. A renegade ST microcatheter was advanced over a Fathom 16 wire into a branch off the superior mesenteric artery. Arteriography demonstrates anatomy most consistent with the middle colic artery. There is no communication with the gastroduodenal artery. The microcatheter was brought back into the main superior mesenteric artery. The catheter was then successfully navigated into the inferior pancreaticoduodenal artery. Arteriography was performed in multiple obliquities confirming the anatomy of the inferior pancreatic 0 duodenal artery, its anastomosis with the right gastroepiploic artery and right gastroduodenal artery. Again, there is a small beak of an artery consistent with an injured vessel. This appears to be in the expected location of the descending duodenum. The microcatheter was successfully navigated into the gastroduodenal artery. Contrast extravasation in the region of the abnormal branch artery demonstrates active extravasation. The microcatheter was successfully navigated  beyond the injured artery into the gastroduodenal artery more proximal to the proper hepatic artery. Coil embolization was then performed of the distal segment of the gastroduodenal artery across the origin of the injured arterial branch vessel using a series of 3, 4 and 5 mm fibered and non fibered interlock detachable microcoils. Care was taken to make sure the coil pack did not extend across the origin of the right gastroepiploic artery. Post embolization arteriography demonstrates complete cessation of flow within that segment of the gastroduodenal artery, no further extravasation from the injured artery and preservation of flow through the gastroepiploic artery. The microcatheter and 5 French catheters were removed. A limited right common femoral arteriogram was performed confirming common femoral arterial access. Of note, the patient has a high bifurcation. Hemostasis was attained with the assistance of a 5 [place] Cordis ExoSeal extra arterial vascular plug. IMPRESSION: 1. Evidence of arterial injury/active bleeding arising from a branch vessel from the distal gastroduodenal artery. 2. Successful coil embolization of the distal segment of the gastroduodenal artery including the origin of the injured arterial branch. Signed, Sterling Big, MD, RPVI [facility] Radiology Electronically Signed   By: Malachy Moan M.D.   On: 05/18/2018 17:17   Dg Chest Port 1 View  Result Date: 05/18/2018 CLINICAL DATA:  Shortness of breath, weakness, stumbling, abdominal pain for 5 days, dark-colored stools, history of peptic ulcers, smoker EXAM: PORTABLE CHEST 1 VIEW COMPARISON:  Portable exam 0846 hours compared 04/24/2018 FINDINGS: Minimal enlargement of cardiac silhouette. Atherosclerotic calcification aorta. Mediastinal contours and pulmonary vascularity normal. Minimal subsegmental atelectasis at LEFT costophrenic angle. Lungs otherwise clear. No infiltrate,  pleural effusion or pneumothorax. Bones demineralized. IMPRESSION: Minimal LEFT basilar subsegmental atelectasis. Electronically Signed   By: Ulyses Southward M.D.   On: 05/18/2018 09:22   Ir Embo Art  Peter Minium Hem[facility] [employer] Guide Roadmapping  Result Date: 05/18/2018 EXAM: SELECTIVE VISCERAL ARTERIOGRAPHY; ADDITIONAL ARTERIOGRAPHY; IR EMBO ART VEN HEMORR LYMPH EXTRAV INC GUIDE ROADMAPPING; IR ULTRASOUND GUIDANCE VASC ACCESS RIGHT MEDICATIONS: None ANESTHESIA/SEDATION: Moderate (conscious) sedation was employed during this procedure. A total of Versed 1.5 mg and Fentanyl 50 mcg was administered intravenously. Moderate Sedation Time: 62 minutes. The patient's level of consciousness and vital signs were monitored continuously by radiology nursing throughout the procedure under my direct supervision. CONTRAST:  20mL ISOVUE-300 IOPAMIDOL (ISOVUE-300) INJECTION 61%, 70mL ISOVUE-300 IOPAMIDOL (ISOVUE-300) INJECTION 61% FLUOROSCOPY TIME:  Fluoroscopy Time: 20 minutes 0 seconds (1913 mGy). COMPLICATIONS: None immediate. PROCEDURE: Informed consent was obtained from the patient following explanation of the procedure, risks, benefits and alternatives. The patient understands, agrees and consents for the procedure. All questions were addressed. A  time out was performed prior to the initiation of the procedure. Maximal barrier sterile technique utilized including caps, mask, sterile gowns, sterile gloves, large sterile drape, hand hygiene, and Betadine prep. The right common femoral artery was interrogated with ultrasound and found to be widely patent. An image was obtained and stored for the medical record. Local anesthesia was attained by infiltration with 1% lidocaine. A small dermatotomy was made. Under real-time sonographic guidance, the vessel was punctured with a 21 gauge micropuncture needle. Using standard technique, the initial micro needle was exchanged over a 0.018 micro wire for a transitional 4 [place] micro  sheath. The micro sheath was then exchanged over a 0.035 wire for a 5 French vascular sheath. A C2 cobra catheter was advanced over a Bentson wire into the abdominal aorta. Despite multiple attempts, the origin of the celiac artery could not be catheterized. The origin of the superior mesenteric artery was catheterized. Arteriography was performed. The inferior pancreaticoduodenal arcade is hypertrophic and provides robust flow retrograde up the gastroduodenal arteries supplying the right a proper hepatic artery. 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 There is a small vessel stump in the region of the inferior genu of the gastroduodenal artery concerning for and injured/recently bleeding artery. A renegade ST microcatheter was advanced over a Fathom 16 wire into a branch off the superior mesenteric artery. Arteriography demonstrates anatomy most consistent with the middle colic artery. There is no communication with the gastroduodenal artery. The microcatheter was brought back into the main superior mesenteric artery. The catheter was then successfully navigated into the inferior pancreaticoduodenal artery. Arteriography was performed in multiple obliquities confirming the anatomy of the inferior pancreatic 0 duodenal artery, its anastomosis with the right gastroepiploic artery and right gastroduodenal artery. Again, there is a small beak of an artery consistent with an injured vessel. This appears to be in the expected location of the descending duodenum. The microcatheter was successfully navigated into the gastroduodenal artery. Contrast extravasation in the region of the abnormal branch artery demonstrates active extravasation. The microcatheter was successfully navigated beyond the injured artery into the gastroduodenal artery more proximal to the proper hepatic artery. Coil embolization was then performed of the distal segment of the gastroduodenal artery across the origin of  the injured arterial branch vessel using a series of 3, 4 and 5 mm fibered and non fibered interlock detachable microcoils. Care was taken to make sure the coil pack did not extend across the origin of the right gastroepiploic artery. Post embolization arteriography demonstrates complete cessation of flow within that segment of the gastroduodenal artery, no further extravasation from the injured artery and preservation of flow through the gastroepiploic artery. The microcatheter and 5 French catheters were removed. A limited right common femoral arteriogram was performed confirming common femoral arterial access. Of note, the patient has a high bifurcation. Hemostasis was attained with the assistance of a 5 [place] Cordis ExoSeal extra arterial vascular plug. IMPRESSION: 1. Evidence of arterial injury/active bleeding arising from a branch vessel from the distal gastroduodenal artery. 2. Successful coil embolization of the distal segment of the gastroduodenal artery including the origin of the injured arterial branch. Signed, Sterling Big, MD, RPVI [facility] Radiology Electronically Signed   By: Malachy Moan M.D.   On: 05/18/2018 17:17     Medical Consultants:    None.  Anti-Infectives:   none  Subjective:    Yadriel Kerrigan Steuart that he does not feel good  continues to have melanotic stools some abdominal discomfort.  Objective:    Vitals:   05/19/18 0200 05/19/18 0353 05/19/18 0400 05/19/18 0600  BP: (!) 146/56  (!) 128/59 (!) 137/59  Pulse: 77  68 84  Resp: 13  16 12   Temp:  99.2 F (37.3 C)    TempSrc:  Oral    SpO2: 96%  96% 100%  Weight:      Height:        Intake/Output Summary (Last 24 hours) at 05/19/2018 0728 Last data filed at 05/19/2018 0600 Gross per 24 hour  Intake 3302.32 ml  Output 1500 ml  Net 1802.32 ml   Filed Weights   05/18/18 1323  Weight: 56.7 kg    Exam: General exam: In no acute  distress. Respiratory system: Good air movement and clear to auscultation. Cardiovascular system: S1 & S2 heard, RRR.   Gastrointestinal system: Positive bowel sounds soft, with epigastric tenderness no rebound or guarding. Central nervous system: Alert and oriented. No focal neurological deficits. Extremities: No pedal edema. Skin: No rashes, lesions or ulcers Psychiatry: Judgement and insight appear normal. Mood & affect appropriate.    Data Reviewed:    Labs: Basic Metabolic Panel: Recent Labs  Lab 05/18/18 0827 05/18/18 0840 05/19/18 0344  NA 142  --  141  K 2.9*  --  3.6  CL 104  --  109  CO2 28  --  24  GLUCOSE 111*  --  110*  BUN 18  --  12  CREATININE 0.94  --  0.63  CALCIUM 8.7*  --  8.2*  MG  --  2.2  --    GFR Estimated Creatinine Clearance: 71.9 mL/min (by C-G formula based on SCr of 0.63 mg/dL). Liver Function Tests: Recent Labs  Lab 05/18/18 0827 05/19/18 0344  AST 47* 19  ALT 34 20  ALKPHOS 137* 85  BILITOT 0.6 0.6  PROT 6.5 5.1*  ALBUMIN 3.4* 2.6*   No results for input(s): LIPASE, AMYLASE in the last 168 hours. No results for input(s): AMMONIA in the last 168 hours. Coagulation profile Recent Labs  Lab 05/18/18 0827  INR 0.97    CBC: Recent Labs  Lab 05/18/18 0827 05/18/18 1715 05/18/18 2235 05/19/18 0344  WBC 8.2  --   --  6.5  NEUTROABS 6.4  --   --   --   HGB 7.3* 6.9* 7.0* 7.4*  HCT 23.8* 22.5* 22.4* 23.2*  MCV 82.6  --   --  86.9  PLT 341  --   --  201   Cardiac Enzymes: No results for input(s): CKTOTAL, CKMB, CKMBINDEX, TROPONINI in the last 168 hours. BNP (last 3 results) No results for input(s): PROBNP in the last 8760 hours. CBG: No results for input(s): GLUCAP in the last 168 hours. D-Dimer: No results for input(s): DDIMER in the last 72 hours. Hgb A1c: No results for input(s): HGBA1C in the last 72 hours. Lipid Profile: No results for input(s): CHOL, HDL, LDLCALC, TRIG, CHOLHDL, LDLDIRECT in the last 72  hours. Thyroid function studies: No results for input(s): TSH, T4TOTAL, T3FREE, THYROIDAB in the last 72 hours.  Invalid input(s): FREET3 Anemia work up: No results for input(s): VITAMINB12, FOLATE, FERRITIN, TIBC, IRON, RETICCTPCT in the last 72 hours. Sepsis Labs: Recent Labs  Lab 05/18/18 0827 05/19/18 0344  WBC 8.2 6.5   Microbiology Recent Results (from the past 240 hour(s))  MRSA PCR Screening     Status: None   Collection Time: 05/18/18  1:17  PM  Result Value Ref Range Status   MRSA by PCR NEGATIVE NEGATIVE Final    Comment:        The GeneXpert MRSA Assay (FDA approved for NASAL specimens only), is one component of a comprehensive MRSA colonization surveillance program. It is not intended to diagnose MRSA infection nor to guide or monitor treatment for MRSA infections. Performed at [facility], [address] [place], [place] [address]      Medications:   . nicotine  21 mg Transdermal Daily  . [START ON 05/21/2018] pantoprazole  40 mg Intravenous Q12H   Continuous Infusions: . octreotide  (SANDOSTATIN)    IV infusion 25 mcg/hr (05/19/18 0600)  . pantoprozole (PROTONIX) infusion 8 mg/hr (05/19/18 0200)     LOS: 1 day   Marinda Elk  [facility] Pager [phone]  *Please refer to [email or web address], password [email or web address] to get updated schedule on who will round on this patient, as hospitalists switch teams weekly. If 7PM-7AM, please contact night-coverage at [email or web address], password [email or web address] for any overnight needs.  05/19/2018, 7:28 AM

## 2018-05-20 LAB — HEMOGLOBIN AND HEMATOCRIT, BLOOD
HCT: 22 % — ABNORMAL LOW (ref 39.0–52.0)
HEMATOCRIT: 22.8 % — AB (ref 39.0–52.0)
HEMATOCRIT: 25.5 % — AB (ref 39.0–52.0)
HEMATOCRIT: 26 % — AB (ref 39.0–52.0)
HEMOGLOBIN: 7.3 g/dL — AB (ref 13.0–17.0)
HEMOGLOBIN: 8.5 g/dL — AB (ref 13.0–17.0)
Hemoglobin: 7 g/dL — ABNORMAL LOW (ref 13.0–17.0)
Hemoglobin: 8.6 g/dL — ABNORMAL LOW (ref 13.0–17.0)

## 2018-05-20 LAB — PREPARE RBC (CROSSMATCH)

## 2018-05-20 MED ORDER — BOOST / RESOURCE BREEZE PO LIQD CUSTOM
1.0000 | Freq: Three times a day (TID) | ORAL | Status: DC
  Administered 2018-05-21 – 2018-05-22 (×3): 1 via ORAL

## 2018-05-20 MED ORDER — SODIUM CHLORIDE 0.9% IV SOLUTION: Freq: Once | INTRAVENOUS | Status: DC

## 2018-05-20 MED ORDER — HYDRALAZINE HCL 20 MG/ML IJ SOLN
5.0000 mg | Freq: Four times a day (QID) | INTRAMUSCULAR | Status: DC | PRN
  Administered 2018-05-20 (×2): 5 mg via INTRAVENOUS
  Filled 2018-05-20 (×2): qty 1

## 2018-05-20 NOTE — Progress Notes (Signed)
Referring Physician(s): Dr. Marca Ancona  Supervising Physician: Richarda Overlie  Patient Status:  [facility] - In-pt  Chief Complaint: Follow-up angiogram and GDA embolization 10/11 by Dr. Archer Asa  Subjective:  67 y/o M with past medical history significant for HTN, bipolar d/o, depression, ETOH abuse who presented to [facility] ED on 10/11 with complaints of fatigue, lightheadedness, melena, dyspnea with exertion, nausea and epigastric discomfort. He was found to have hemoglobin 7.3 and GI was consulted for GIB. He underwent EGD yesterday who showed esophagitis with coffee-ground fluid in the gastric cavity, mild antral erythema and a large cratered duodenal ulcer with large adherent clot which could not be displaced on lavage. Consult was placed to IR for mesenteric angiogram with prophylactic embolization of GDA which was successfully performed on 10/11 by Dr. Archer Asa.  Patient reports that he continues to feel bad overall, tired and weak - upset that he is not allowed to eat or move around his room yet. Denies noting any blood in his stool, intermittent nausea without vomiting, abdominal discomfort but not pain.  Allergies: Patient has no known allergies.  Medications: Prior to Admission medications   Medication Sig Start Date End Date Taking? Authorizing Provider  acetaminophen (TYLENOL) 500 MG tablet Take 500 mg by mouth every 6 (six) hours as needed for moderate pain.   Yes [provider]  citalopram (CELEXA) 40 MG tablet Take 1 tablet (40 mg total) by mouth daily. For depression 05/01/18  Yes Armandina Stammer I, NP  diclofenac (VOLTAREN) 75 MG EC tablet Take 75 mg by mouth 2 (two) times daily.   Yes [provider]  gabapentin (NEURONTIN) 400 MG capsule Take 2 capsules (800 mg total) by mouth 3 (three) times daily. For agitation 05/01/18  Yes Armandina Stammer I, NP  lamoTRIgine (LAMICTAL) 200 MG tablet Take 1 tablet (200 mg total) by mouth 2 (two) times daily. For mood stabilization  05/01/18  Yes Nwoko, Nicole Kindred I, NP  levothyroxine (SYNTHROID, LEVOTHROID) 75 MCG tablet Take 1 tablet (75 mcg total) by mouth daily before breakfast. For thyroid hormone replacement 05/02/18  Yes Nwoko, Nicole Kindred I, NP  lisinopril (PRINIVIL,ZESTRIL) 20 MG tablet Take 1 tablet (20 mg total) by mouth daily. For high blood pressure 05/01/18  Yes Nwoko, Agnes I, NP  mirtazapine (REMERON) 30 MG tablet Take 1 tablet (30 mg total) by mouth at bedtime. For depression 05/01/18  Yes Armandina Stammer I, NP  Multiple Vitamins-Minerals (MULTIVITAMIN WITH MINERALS) tablet Take 1 tablet by mouth daily.   Yes [provider]  naproxen sodium (ALEVE) 220 MG tablet Take 440 mg by mouth daily as needed (pain).   Yes [provider]  nicotine (NICODERM CQ - DOSED IN MG/24 HOURS) 21 mg/24hr patch Place 1 patch (21 mg total) onto the skin daily. (May buy from over the counter): For nicotine withdrawal 05/02/18  Yes Armandina Stammer I, NP  pantoprazole (PROTONIX) 40 MG tablet Take 1 tablet (40 mg total) by mouth 2 (two) times daily. For acid reflux 05/01/18  Yes Nwoko, Nicole Kindred I, NP  sucralfate (CARAFATE) 1 g tablet Take 1 tablet (1 g total) by mouth 4 (four) times daily -  with meals and at bedtime. For Ulcer 05/01/18  Yes Armandina Stammer I, NP  traMADol (ULTRAM) 50 MG tablet Take 50 mg by mouth 3 (three) times daily as needed for moderate pain.   Yes [provider]  hydrOXYzine (ATARAX/VISTARIL) 50 MG tablet Take 1 tablet (50 mg total) by mouth every 6 (six) hours as needed  for anxiety. Patient not taking: Reported on 05/18/2018 05/01/18   Armandina Stammer I, NP     Vital Signs: BP (!) 173/80   Pulse 80   Temp 98.2 F (36.8 C) (Oral)   Resp 12   Ht 5\' 6"  (1.676 m)   Wt 125 lb (56.7 kg)   SpO2 100%   BMI 20.18 kg/m   Physical Exam  Constitutional: He is oriented to person, place, and time. No distress.  HENT:  Head: Normocephalic.  Cardiovascular: Normal rate, regular rhythm and normal heart sounds.    Pulmonary/Chest: Effort normal and breath sounds normal.  Abdominal: Soft. He exhibits no distension. There is no tenderness.  Neurological: He is alert and oriented to person, place, and time.  Skin: Skin is warm and dry. He is not diaphoretic.  Psychiatric: He has a normal mood and affect. His behavior is normal.  Nursing note and vitals reviewed.   Imaging: Ir Angiogram Visceral Selective  Result Date: 05/18/2018 EXAM: SELECTIVE VISCERAL ARTERIOGRAPHY; ADDITIONAL ARTERIOGRAPHY; IR EMBO ART VEN HEMORR LYMPH EXTRAV INC GUIDE ROADMAPPING; IR ULTRASOUND GUIDANCE VASC ACCESS RIGHT MEDICATIONS: None ANESTHESIA/SEDATION: Moderate (conscious) sedation was employed during this procedure. A total of Versed 1.5 mg and Fentanyl 50 mcg was administered intravenously. Moderate Sedation Time: 62 minutes. The patient's level of consciousness and vital signs were monitored continuously by radiology nursing throughout the procedure under my direct supervision. CONTRAST:  20mL ISOVUE-300 IOPAMIDOL (ISOVUE-300) INJECTION 61%, 70mL ISOVUE-300 IOPAMIDOL (ISOVUE-300) INJECTION 61% FLUOROSCOPY TIME:  Fluoroscopy Time: 20 minutes 0 seconds (1913 mGy). COMPLICATIONS: None immediate. PROCEDURE: Informed consent was obtained from the patient following explanation of the procedure, risks, benefits and alternatives. The patient understands, agrees and consents for the procedure. All questions were addressed. A time out was performed prior to the initiation of the procedure. Maximal barrier sterile technique utilized including caps, mask, sterile gowns, sterile gloves, large sterile drape, hand hygiene, and Betadine prep. The right common femoral artery was interrogated with ultrasound and found to be widely patent. An image was obtained and stored for the medical record. Local anesthesia was attained by infiltration with 1% lidocaine. A small dermatotomy was made. Under real-time sonographic guidance, the vessel was punctured  with a 21 gauge micropuncture needle. Using standard technique, the initial micro needle was exchanged over a 0.018 micro wire for a transitional 4 [place] micro sheath. The micro sheath was then exchanged over a 0.035 wire for a 5 French vascular sheath. A C2 cobra catheter was advanced over a Bentson wire into the abdominal aorta. Despite multiple attempts, the origin of the celiac artery could not be catheterized. The origin of the superior mesenteric artery was catheterized. Arteriography was performed. The inferior pancreaticoduodenal arcade is hypertrophic and provides robust flow retrograde up the gastroduodenal arteries supplying the right a proper hepatic artery. 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 There is a small vessel stump in the region of the inferior genu of the gastroduodenal artery concerning for and injured/recently bleeding artery. A renegade ST microcatheter was advanced over a Fathom 16 wire into a branch off the superior mesenteric artery. Arteriography demonstrates anatomy most consistent with the middle colic artery. There is no communication with the gastroduodenal artery. The microcatheter was brought back into the main superior mesenteric artery. The catheter was then successfully navigated into the inferior pancreaticoduodenal artery. Arteriography was performed in multiple obliquities confirming the anatomy of the inferior pancreatic 0 duodenal artery, its anastomosis with the right gastroepiploic artery  and right gastroduodenal artery. Again, there is a small beak of an artery consistent with an injured vessel. This appears to be in the expected location of the descending duodenum. The microcatheter was successfully navigated into the gastroduodenal artery. Contrast extravasation in the region of the abnormal branch artery demonstrates active extravasation. The microcatheter was successfully navigated beyond the injured artery into the gastroduodenal  artery more proximal to the proper hepatic artery. Coil embolization was then performed of the distal segment of the gastroduodenal artery across the origin of the injured arterial branch vessel using a series of 3, 4 and 5 mm fibered and non fibered interlock detachable microcoils. Care was taken to make sure the coil pack did not extend across the origin of the right gastroepiploic artery. Post embolization arteriography demonstrates complete cessation of flow within that segment of the gastroduodenal artery, no further extravasation from the injured artery and preservation of flow through the gastroepiploic artery. The microcatheter and 5 French catheters were removed. A limited right common femoral arteriogram was performed confirming common femoral arterial access. Of note, the patient has a high bifurcation. Hemostasis was attained with the assistance of a 5 [place] Cordis ExoSeal extra arterial vascular plug. IMPRESSION: 1. Evidence of arterial injury/active bleeding arising from a branch vessel from the distal gastroduodenal artery. 2. Successful coil embolization of the distal segment of the gastroduodenal artery including the origin of the injured arterial branch. Signed, Sterling Big, MD, RPVI [facility] Radiology Electronically Signed   By: Malachy Moan M.D.   On: 05/18/2018 17:17   Ir Angiogram Selective Each Additional Vessel  Result Date: 05/18/2018 EXAM: SELECTIVE VISCERAL ARTERIOGRAPHY; ADDITIONAL ARTERIOGRAPHY; IR EMBO ART VEN HEMORR LYMPH EXTRAV INC GUIDE ROADMAPPING; IR ULTRASOUND GUIDANCE VASC ACCESS RIGHT MEDICATIONS: None ANESTHESIA/SEDATION: Moderate (conscious) sedation was employed during this procedure. A total of Versed 1.5 mg and Fentanyl 50 mcg was administered intravenously. Moderate Sedation Time: 62 minutes. The patient's level of consciousness and vital signs were monitored continuously by radiology nursing throughout  the procedure under my direct supervision. CONTRAST:  20mL ISOVUE-300 IOPAMIDOL (ISOVUE-300) INJECTION 61%, 70mL ISOVUE-300 IOPAMIDOL (ISOVUE-300) INJECTION 61% FLUOROSCOPY TIME:  Fluoroscopy Time: 20 minutes 0 seconds (1913 mGy). COMPLICATIONS: None immediate. PROCEDURE: Informed consent was obtained from the patient following explanation of the procedure, risks, benefits and alternatives. The patient understands, agrees and consents for the procedure. All questions were addressed. A time out was performed prior to the initiation of the procedure. Maximal barrier sterile technique utilized including caps, mask, sterile gowns, sterile gloves, large sterile drape, hand hygiene, and Betadine prep. The right common femoral artery was interrogated with ultrasound and found to be widely patent. An image was obtained and stored for the medical record. Local anesthesia was attained by infiltration with 1% lidocaine. A small dermatotomy was made. Under real-time sonographic guidance, the vessel was punctured with a 21 gauge micropuncture needle. Using standard technique, the initial micro needle was exchanged over a 0.018 micro wire for a transitional 4 [place] micro sheath. The micro sheath was then exchanged over a 0.035 wire for a 5 French vascular sheath. A C2 cobra catheter was advanced over a Bentson wire into the abdominal aorta. Despite multiple attempts, the origin of the celiac artery could not be catheterized. The origin of the superior mesenteric artery was catheterized. Arteriography was performed. The inferior pancreaticoduodenal arcade is hypertrophic and provides robust flow retrograde up the gastroduodenal arteries supplying the right a proper hepatic artery. 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 There is a small vessel stump in the region of the inferior genu of the gastroduodenal artery concerning for and injured/recently bleeding artery. A renegade ST microcatheter was  advanced over a Fathom 16 wire into a branch off the superior mesenteric artery. Arteriography demonstrates anatomy most consistent with the middle colic artery. There is no communication with the gastroduodenal artery. The microcatheter was brought back into the main superior mesenteric artery. The catheter was then successfully navigated into the inferior pancreaticoduodenal artery. Arteriography was performed in multiple obliquities confirming the anatomy of the inferior pancreatic 0 duodenal artery, its anastomosis with the right gastroepiploic artery and right gastroduodenal artery. Again, there is a small beak of an artery consistent with an injured vessel. This appears to be in the expected location of the descending duodenum. The microcatheter was successfully navigated into the gastroduodenal artery. Contrast extravasation in the region of the abnormal branch artery demonstrates active extravasation. The microcatheter was successfully navigated beyond the injured artery into the gastroduodenal artery more proximal to the proper hepatic artery. Coil embolization was then performed of the distal segment of the gastroduodenal artery across the origin of the injured arterial branch vessel using a series of 3, 4 and 5 mm fibered and non fibered interlock detachable microcoils. Care was taken to make sure the coil pack did not extend across the origin of the right gastroepiploic artery. Post embolization arteriography demonstrates complete cessation of flow within that segment of the gastroduodenal artery, no further extravasation from the injured artery and preservation of flow through the gastroepiploic artery. The microcatheter and 5 French catheters were removed. A limited right common femoral arteriogram was performed confirming common femoral arterial access. Of note, the patient has a high bifurcation. Hemostasis was attained with the assistance of a 5 [place] Cordis ExoSeal extra arterial vascular plug.  IMPRESSION: 1. Evidence of arterial injury/active bleeding arising from a branch vessel from the distal gastroduodenal artery. 2. Successful coil embolization of the distal segment of the gastroduodenal artery including the origin of the injured arterial branch. Signed, Heath K. McCullough, MD, RPVI [facility]  Radiology Electronically Signed   By: Heath  McCullough M.D.   On: 05/18/2018 17:17   Ir Angiogram Selective Each Additional Vessel  Result Date: 05/18/2018 EXAM: SELECTIVE VISCERAL ARTERIOGRAPHY; ADDITIONAL ARTERIOGRAPHY; IR EMBO ART VEN HEMORR LYMPH EXTRAV INC GUIDE ROADMAPPING; IR ULTRASOUND GUIDANCE VASC ACCESS RIGHT MEDICATIONS: None ANESTHESIA/SEDATION: Moderate (conscious) sedation was employed during this procedure. A total of Versed 1.5 mg and Fentanyl 50 mcg was administered intravenously. Moderate Sedation Time: 62 minutes. The patient's level of consciousness and vital signs were monitored continuously by radiology nursing throughout the procedure under my direct supervision. CONTRAST:  20mL ISOVUE-300 IOPAMIDOL ([phone] [phone] Poag00) [id number] PoagION 61%, [phone] Poagrkfiel5mMenton79mShawneetow48mStamfor44302-396-333Lauris [phone] P[phone] Poag[place]66mWanaqu42mSan Pabl12mSouthsid11mChristma42mMassanetta SpringsmL ISOVUE-300 IOPAMIDOL (ISOVUE-300) INJECTION 61% FLUOROSCOPY TIME:  Fluoroscopy Time: 20 minutes 0 seconds (1913 mGy). COMPLICATIONS: None immediate. PROCEDURE: Informed consent was obtained from the patient following explanation of the procedure, risks, benefits and alternatives. The patient understands, agrees and consents for the procedure. All questions were addressed. A time out was performed prior to the initiation of the procedure. Maximal barrier sterile technique utilized including caps, mask, sterile gowns, sterile gloves, large sterile drape, hand hygiene, and Betadine prep. The right common femoral artery was interrogated with ultrasound and found to be widely patent. An image was obtained and stored for the medical record. Local anesthesia was attained by infiltration with 1% lidocaine. A  small dermatotomy was made. Under  real-time sonographic guidance, the vessel was punctured with a 21 gauge micropuncture needle. Using standard technique, the initial micro needle was exchanged over a 0.018 micro wire for a transitional 4 [place] micro sheath. The micro sheath was then exchanged over a 0.035 wire for a 5 French vascular sheath. A C2 cobra catheter was advanced over a Bentson wire into the abdominal aorta. Despite multiple attempts, the origin of the celiac artery could not be catheterized. The origin of the superior mesenteric artery was catheterized. Arteriography was performed. The inferior pancreaticoduodenal arcade is hypertrophic and provides robust flow retrograde up the gastroduodenal arteries supplying the right a proper hepatic artery. 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 There is a small vessel stump in the region of the inferior genu of the gastroduodenal artery concerning for and injured/recently bleeding artery. A renegade ST microcatheter was advanced over a Fathom 16 wire into a branch off the superior mesenteric artery. Arteriography demonstrates anatomy most consistent with the middle colic artery. There is no communication with the gastroduodenal artery. The microcatheter was brought back into the main superior mesenteric artery. The catheter was then successfully navigated into the inferior pancreaticoduodenal artery. Arteriography was performed in multiple obliquities confirming the anatomy of the inferior pancreatic 0 duodenal artery, its anastomosis with the right gastroepiploic artery and right gastroduodenal artery. Again, there is a small beak of an artery consistent with an injured vessel. This appears to be in the expected location of the descending duodenum. The microcatheter was successfully navigated into the gastroduodenal artery. Contrast extravasation in the region of the abnormal branch artery demonstrates active extravasation. The  microcatheter was successfully navigated beyond the injured artery into the gastroduodenal artery more proximal to the proper hepatic artery. Coil embolization was then performed of the distal segment of the gastroduodenal artery across the origin of the injured arterial branch vessel using a series of 3, 4 and 5 mm fibered and non fibered interlock detachable microcoils. Care was taken to make sure the coil pack did not extend across the origin of the right gastroepiploic artery. Post embolization arteriography demonstrates complete cessation of flow within that segment of the gastroduodenal artery, no further extravasation from the injured artery and preservation of flow through the gastroepiploic artery. The microcatheter and 5 French catheters were removed. A limited right common femoral arteriogram was performed confirming common femoral arterial access. Of note, the patient has a high bifurcation. Hemostasis was attained with the assistance of a 5 [place] Cordis ExoSeal extra arterial vascular plug. IMPRESSION: 1. Evidence of arterial injury/active bleeding arising from a branch vessel from the distal gastroduodenal artery. 2. Successful coil embolization of the distal segment of the gastroduodenal artery including the origin of the injured arterial branch. Signed, Sterling Big, MD, RPVI [facility] Radiology Electronically Signed   By: Malachy Moan M.D.   On: 05/18/2018 17:17   Ir Angiogram Selective Each Additional Vessel  Result Date: 05/18/2018 EXAM: SELECTIVE VISCERAL ARTERIOGRAPHY; ADDITIONAL ARTERIOGRAPHY; IR EMBO ART VEN HEMORR LYMPH EXTRAV INC GUIDE ROADMAPPING; IR ULTRASOUND GUIDANCE VASC ACCESS RIGHT MEDICATIONS: None ANESTHESIA/SEDATION: Moderate (conscious) sedation was employed during this procedure. A total of Versed 1.5 mg and Fentanyl 50 mcg was administered intravenously. Moderate Sedation Time: 62 minutes. The patient's level  of consciousness and vital signs were monitored continuously by radiology nursing throughout the procedure under my direct supervision. CONTRAST:  20mL ISOVUE-300 IOPAMIDOL (ISOVUE-300) INJECTION 61%, 70mL ISOVUE-300 IOPAMIDOL (ISOVUE-300) INJECTION 61% FLUOROSCOPY TIME:  Fluoroscopy Time: 20 minutes 0 seconds (1913 mGy). COMPLICATIONS: None immediate. PROCEDURE: Informed consent was obtained from the patient following explanation of the procedure, risks, benefits and alternatives. The patient understands, agrees and consents for the procedure. All questions were addressed. A time out was performed prior to the initiation of the procedure. Maximal barrier sterile technique utilized including caps, mask, sterile gowns, sterile gloves, large sterile drape, hand hygiene, and Betadine prep. The right common femoral artery was interrogated with ultrasound and found to be widely patent. An image was obtained and stored for the medical record. Local anesthesia was attained by infiltration with 1% lidocaine. A small dermatotomy was made. Under real-time sonographic guidance, the vessel was punctured with a 21 gauge micropuncture needle. Using standard technique, the initial micro needle was exchanged over a 0.018 micro wire for a transitional 4 [place] micro sheath. The micro sheath was then exchanged over a 0.035 wire for a 5 French vascular sheath. A C2 cobra catheter was advanced over a Bentson wire into the abdominal aorta. Despite multiple attempts, the origin of the celiac artery could not be catheterized. The origin of the superior mesenteric artery was catheterized. Arteriography was performed. The inferior pancreaticoduodenal arcade is hypertrophic and provides robust flow retrograde up the gastroduodenal arteries supplying the right a proper hepatic artery. 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 There is a small vessel stump in the region of the inferior genu of the gastroduodenal  artery concerning for and injured/recently bleeding artery. A renegade ST microcatheter was advanced over a Fathom 16 wire into a branch off the superior mesenteric artery. Arteriography demonstrates anatomy most consistent with the middle colic artery. There is no communication with the gastroduodenal artery. The microcatheter was brought back into the main superior mesenteric artery. The catheter was then successfully navigated into the inferior pancreaticoduodenal artery. Arteriography was performed in multiple obliquities confirming the anatomy of the inferior pancreatic 0 duodenal artery, its anastomosis with the right gastroepiploic artery and right gastroduodenal artery. Again, there is a small beak of an artery consistent with an injured vessel. This appears to be in the expected location of the descending duodenum. The microcatheter was successfully navigated into the gastroduodenal artery. Contrast extravasation in the region of the abnormal branch artery demonstrates active extravasation. The microcatheter was successfully navigated beyond the injured artery into the gastroduodenal artery more proximal to the proper hepatic artery. Coil embolization was then performed of the distal segment of the gastroduodenal artery across the origin of the injured arterial branch vessel using a series of 3, 4 and 5 mm fibered and non fibered interlock detachable microcoils. Care was taken to make sure the coil pack did not extend across the origin of the right gastroepiploic artery. Post embolization arteriography demonstrates complete cessation of flow within that segment of the gastroduodenal artery, no further extravasation from the injured artery and preservation of flow through the gastroepiploic artery. The microcatheter and 5 French catheters were removed. A limited right common femoral arteriogram was performed confirming common femoral arterial access. Of note, the patient has a high bifurcation. Hemostasis  was attained with the assistance of a 5 [place] Cordis ExoSeal extra arterial vascular plug. IMPRESSION: 1. Evidence of arterial injury/active bleeding arising from a branch vessel from the distal gastroduodenal artery. 2. Successful coil embolization of the distal segment of the gastroduodenal artery including the origin of the injured arterial branch. Signed, Sterling Big, MD, RPVI [facility] Radiology Electronically  Signed   By: Malachy Moan M.D.   On: 05/18/2018 17:17   Ir US Guide Vasc Access Right  Result Date: 05/18/2018 EXAM: SELECTIVE VISCERAL ARTERIOGRAPHY; ADDITIONAL ARTERIOGRAPHY; IR EMBO ART VEN HEMORR LYMPH EXTRAV INC GUIDE ROADMAPPING; IR ULTRASOUND GUIDANCE VASC ACCESS RIGHT MEDICATIONS: None ANESTHESIA/SEDATION: Moderate (conscious) sedation was employed during this procedure. A total of Versed 1.5 mg and Fentanyl 50 mcg was administered intravenously. Moderate Sedation Time: 62 minutes. The patient's level of consciousness and vital signs were monitored continuously by radiology nursing throughout the procedure under my direct supervision. CONTRAST:  20mL ISOVUE-300 IOPAMIDOL (ISOVUE-300) INJECTION 61%, 70mL ISOVUE-300 IOPAMIDOL (ISOVUE-300) INJECTION 61% FLUOROSCOPY TIME:  Fluoroscopy Time: 20 minutes 0 seconds (1913 mGy). COMPLICATIONS: None immediate. PROCEDURE: Informed consent was obtained from the patient following explanation of the procedure, risks, benefits and alternatives. The patient understands, agrees and consents for the procedure. All questions were addressed. A time out was performed prior to the initiation of the procedure. Maximal barrier sterile technique utilized including caps, mask, sterile gowns, sterile gloves, large sterile drape, hand hygiene, and Betadine prep. The right common femoral artery was interrogated with ultrasound and found to be widely patent. An image was obtained and stored for the medical  record. Local anesthesia was attained by infiltration with 1% lidocaine. A small dermatotomy was made. Under real-time sonographic guidance, the vessel was punctured with a 21 gauge micropuncture needle. Using standard technique, the initial micro needle was exchanged over a 0.018 micro wire for a transitional 4 [place] micro sheath. The micro sheath was then exchanged over a 0.035 wire for a 5 French vascular sheath. A C2 cobra catheter was advanced over a Bentson wire into the abdominal aorta. Despite multiple attempts, the origin of the celiac artery could not be catheterized. The origin of the superior mesenteric artery was catheterized. Arteriography was performed. The inferior pancreaticoduodenal arcade is hypertrophic and provides robust flow retrograde up the gastroduodenal arteries supplying the right a proper hepatic artery. 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 There is a small vessel stump in the region of the inferior genu of the gastroduodenal artery concerning for and injured/recently bleeding artery. A renegade ST microcatheter was advanced over a Fathom 16 wire into a branch off the superior mesenteric artery. Arteriography demonstrates anatomy most consistent with the middle colic artery. There is no communication with the gastroduodenal artery. The microcatheter was brought back into the main superior mesenteric artery. The catheter was then successfully navigated into the inferior pancreaticoduodenal artery. Arteriography was performed in multiple obliquities confirming the anatomy of the inferior pancreatic 0 duodenal artery, its anastomosis with the right gastroepiploic artery and right gastroduodenal artery. Again, there is a small beak of an artery consistent with an injured vessel. This appears to be in the expected location of the descending duodenum. The microcatheter was successfully navigated into the gastroduodenal artery. Contrast extravasation in the  region of the abnormal branch artery demonstrates active extravasation. The microcatheter was successfully navigated beyond the injured artery into the gastroduodenal artery more proximal to the proper hepatic artery. Coil embolization was then performed of the distal segment of the gastroduodenal artery across the origin of the injured arterial branch vessel using a series of 3, 4 and 5 mm fibered and non fibered interlock detachable microcoils. Care was taken to make sure the coil pack did not extend across the origin of the right gastroepiploic artery. Post embolization arteriography demonstrates complete cessation of flow within that segment of  the gastroduodenal artery, no further extravasation from the injured artery and preservation of flow through the gastroepiploic artery. The microcatheter and 5 French catheters were removed. A limited right common femoral arteriogram was performed confirming common femoral arterial access. Of note, the patient has a high bifurcation. Hemostasis was attained with the assistance of a 5 [place] Cordis ExoSeal extra arterial vascular plug. IMPRESSION: 1. Evidence of arterial injury/active bleeding arising from a branch vessel from the distal gastroduodenal artery. 2. Successful coil embolization of the distal segment of the gastroduodenal artery including the origin of the injured arterial branch. Signed, Sterling Big, MD, RPVI [facility] Radiology Electronically Signed   By: Malachy Moan M.D.   On: 05/18/2018 17:17   Dg Chest Port 1 View  Result Date: 05/18/2018 CLINICAL DATA:  Shortness of breath, weakness, stumbling, abdominal pain for 5 days, dark-colored stools, history of peptic ulcers, smoker EXAM: PORTABLE CHEST 1 VIEW COMPARISON:  Portable exam 0846 hours compared 04/24/2018 FINDINGS: Minimal enlargement of cardiac silhouette. Atherosclerotic calcification aorta. Mediastinal contours and pulmonary  vascularity normal. Minimal subsegmental atelectasis at LEFT costophrenic angle. Lungs otherwise clear. No infiltrate, pleural effusion or pneumothorax. Bones demineralized. IMPRESSION: Minimal LEFT basilar subsegmental atelectasis. Electronically Signed   By: Ulyses Southward M.D.   On: 05/18/2018 09:22   Ir Embo Art  Peter Minium Hem[facility] [employer] Guide Roadmapping  Result Date: 05/18/2018 EXAM: SELECTIVE VISCERAL ARTERIOGRAPHY; ADDITIONAL ARTERIOGRAPHY; IR EMBO ART VEN HEMORR LYMPH EXTRAV INC GUIDE ROADMAPPING; IR ULTRASOUND GUIDANCE VASC ACCESS RIGHT MEDICATIONS: None ANESTHESIA/SEDATION: Moderate (conscious) sedation was employed during this procedure. A total of Versed 1.5 mg and Fentanyl 50 mcg was administered intravenously. Moderate Sedation Time: 62 minutes. The patient's level of consciousness and vital signs were monitored continuously by radiology nursing throughout the procedure under my direct supervision. CONTRAST:  20mL ISOVUE-300 IOPAMIDOL (ISOVUE-300) INJECTION 61%, 70mL ISOVUE-300 IOPAMIDOL (ISOVUE-300) INJECTION 61% FLUOROSCOPY TIME:  Fluoroscopy Time: 20 minutes 0 seconds (1913 mGy). COMPLICATIONS: None immediate. PROCEDURE: Informed consent was obtained from the patient following explanation of the procedure, risks, benefits and alternatives. The patient understands, agrees and consents for the procedure. All questions were addressed. A time out was performed prior to the initiation of the procedure. Maximal barrier sterile technique utilized including caps, mask, sterile gowns, sterile gloves, large sterile drape, hand hygiene, and Betadine prep. The right common femoral artery was interrogated with ultrasound and found to be widely patent. An image was obtained and stored for the medical record. Local anesthesia was attained by infiltration with 1% lidocaine. A small dermatotomy was made. Under real-time sonographic guidance, the vessel was punctured with a 21 gauge micropuncture needle. Using  standard technique, the initial micro needle was exchanged over a 0.018 micro wire for a transitional 4 [place] micro sheath. The micro sheath was then exchanged over a 0.035 wire for a 5 French vascular sheath. A C2 cobra catheter was advanced over a Bentson wire into the abdominal aorta. Despite multiple attempts, the origin of the celiac artery could not be catheterized. The origin of the superior mesenteric artery was catheterized. Arteriography was performed. The inferior pancreaticoduodenal arcade is hypertrophic and provides robust flow retrograde up the gastroduodenal arteries supplying the right a proper hepatic artery. 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 There is a small vessel stump in the region of the inferior genu of the gastroduodenal artery concerning for and injured/recently bleeding artery. A renegade ST microcatheter was advanced over  a Fathom 16 wire into a branch off the superior mesenteric artery. Arteriography demonstrates anatomy most consistent with the middle colic artery. There is no communication with the gastroduodenal artery. The microcatheter was brought back into the main superior mesenteric artery. The catheter was then successfully navigated into the inferior pancreaticoduodenal artery. Arteriography was performed in multiple obliquities confirming the anatomy of the inferior pancreatic 0 duodenal artery, its anastomosis with the right gastroepiploic artery and right gastroduodenal artery. Again, there is a small beak of an artery consistent with an injured vessel. This appears to be in the expected location of the descending duodenum. The microcatheter was successfully navigated into the gastroduodenal artery. Contrast extravasation in the region of the abnormal branch artery demonstrates active extravasation. The microcatheter was successfully navigated beyond the injured artery into the gastroduodenal artery more proximal to the proper hepatic  artery. Coil embolization was then performed of the distal segment of the gastroduodenal artery across the origin of the injured arterial branch vessel using a series of 3, 4 and 5 mm fibered and non fibered interlock detachable microcoils. Care was taken to make sure the coil pack did not extend across the origin of the right gastroepiploic artery. Post embolization arteriography demonstrates complete cessation of flow within that segment of the gastroduodenal artery, no further extravasation from the injured artery and preservation of flow through the gastroepiploic artery. The microcatheter and 5 French catheters were removed. A limited right common femoral arteriogram was performed confirming common femoral arterial access. Of note, the patient has a high bifurcation. Hemostasis was attained with the assistance of a 5 [place] Cordis ExoSeal extra arterial vascular plug. IMPRESSION: 1. Evidence of arterial injury/active bleeding arising from a branch vessel from the distal gastroduodenal artery. 2. Successful coil embolization of the distal segment of the gastroduodenal artery including the origin of the injured arterial branch. Signed, Sterling Big, MD, RPVI [facility] Radiology Electronically Signed   By: Malachy Moan M.D.   On: 05/18/2018 17:17    Labs:  CBC: Recent Labs    04/29/18 1828 05/02/18 1822 05/18/18 0827  05/19/18 0344  05/19/18 1648 05/19/18 2253 05/20/18 0107 05/20/18 0653  WBC 6.5 7.7 8.2  --  6.5  --   --   --   --   --   HGB 10.3* 10.7* 7.3*   < > 7.4*   < > 7.1* 7.0* 7.0* 7.3*  HCT 33.0* 33.4* 23.8*   < > 23.2*   < > 21.2* 21.8* 22.0* 22.8*  PLT 348 423* 341  --  201  --   --   --   --   --    < > = values in this interval not displayed.    COAGS: Recent Labs    05/18/18 0827  INR 0.97    BMP: Recent Labs    04/22/18 0914 05/02/18 1822 05/18/18 0827 05/19/18 0344  NA 135 142 142 141  K 3.8 3.4*  2.9* 3.6  CL 98 104 104 109  CO2 25 27 28 24   GLUCOSE 103* 97 111* 110*  BUN 8 15 18 12   CALCIUM 9.4 9.7 8.7* 8.2*  CREATININE 0.81 0.85 0.94 0.63  GFRNONAA >60 >60 >60 >60  GFRAA >60 >60 >60 >60    LIVER FUNCTION TESTS: Recent Labs    04/22/18 0914 05/02/18 1822 05/18/18 0827 05/19/18 0344  BILITOT 0.5 0.5 0.6 0.6  AST 20 16 47* 19  ALT 12 12 34 20  ALKPHOS  73 70 137* 85  PROT 7.6 7.5 6.5 5.1*  ALBUMIN 4.4 4.2 3.4* 2.6*    Assessment and Plan:  S/p GDA embolization for GIB 10/11 by Dr. Archer Asa - abdominal pain improved somewhat but still present, continues with melanotic stools per chart. Tolerating liquids, not cleared for regular diet yet. H/H decreased to 7.0/22.0, hypertensive on exam but vital signs stable otherwise.  He is followed by GI who recommends continued PPI and clear liquid diet, possible surgical consult if GI bleeding continues due to large size of ulcer not amenable to endoscopic hemostasis.  Please call IR with questions or concerns.  Electronically Signed: Villa Herb, PA-C 05/20/2018, 8:59 AM   I spent a total of 15 Minutes at the the patient's bedside AND on the patient's hospital floor or unit, greater than 50% of which was counseling/coordinating care for GDA embolization.

## 2018-05-20 NOTE — Progress Notes (Signed)
[facility] PROGRESS NOTE    Progress Note  Walter Nicholson  ZOX:[id number] DOB: [birth year] DOA: 05/18/2018 PCP: Patient, No Pcp Per     Brief Narrative:   Walter Nicholson is an 67 y.o. male past medical history of depression bipolar disorder alcohol abuse presents with fatigue lightheadedness and black stool, he has been taking Aleve and Voltaren for the last week  Assessment/Plan:   Duodenal ulcer with hemorrhage/GI bleed/Hemorrhagic shock (HCC) Hemoglobin of September was 10.7 on admission is 7.3. He was started on IV Protonix and octreotide, his hemoglobin is drifting down today 7 we will transfuse 1 unit of packed red blood cells check an H&H post transfusional. GI was consulted who recommended an EGD 05/19/2011 that showed severe erosive esophagitis with large duodenal ulcer with visible vessel. IR was consulted for embolization on GDA on 05/18/2018. Continue IV Protonix and octreotide further management per GI. Awaiting his diet continues to have dark melanotic stools.  Probably old blood.  Essential hypertension: Continue to hold antihypertensive medication.  Mild elevation  Transaminitis: Resolved.  Bipolar I disorder, severe, current or most recent episode depressed, with melancholic features (HCC) GI to dictate when we can start his medication.  History of alcohol abuse He relates his last drink was more than 2 months ago no signs of withdrawal.  Hypokalemia: Resolved  DVT prophylaxis: scd Family Communication:none Disposition Plan/Barrier to D/C: keep in SDU Code Status:     Code Status Orders  (From admission, onward)         Start     Ordered   05/18/18 1257  Full code  Continuous     05/18/18 1256        Code Status History    Date Active Date Inactive Code Status Order ID Comments User Context   05/02/2018 1949 05/03/2018 1240 Full Code [id number]  Arby Barrette, MD ED   04/24/2018 1737 05/01/2018 1822 Full Code [id number]  Truman Hayward, FNP Inpatient   04/24/2018 1737 04/24/2018 1737 Full Code [id number]  Truman Hayward, FNP Inpatient   04/22/2018 0958 04/24/2018 1705 Full Code [id number]  Rolan Bucco, MD ED        IV Access:    Peripheral IV   Procedures and diagnostic studies:   Ir Angiogram Visceral Selective  Result Date: 05/18/2018 EXAM: SELECTIVE VISCERAL ARTERIOGRAPHY; ADDITIONAL ARTERIOGRAPHY; IR EMBO ART VEN HEMORR LYMPH EXTRAV INC GUIDE ROADMAPPING; IR ULTRASOUND GUIDANCE VASC ACCESS RIGHT MEDICATIONS: None ANESTHESIA/SEDATION: Moderate (conscious) sedation was employed during this procedure. A total of Versed 1.5 mg and Fentanyl 50 mcg was administered intravenously. Moderate Sedation Time: 62 minutes. The patient's level of consciousness and vital signs were monitored continuously by radiology nursing throughout the procedure under my direct supervision. CONTRAST:  20mL ISOVUE-300 IOPAMIDOL (ISOVUE-300) INJECTION 61%, 70mL ISOVUE-300 IOPAMIDOL (ISOVUE-300) INJECTION 61% FLUOROSCOPY TIME:  Fluoroscopy Time: 20 minutes 0 seconds (1913 mGy). COMPLICATIONS: None immediate. PROCEDURE: Informed consent was obtained from the patient following explanation of the procedure, risks, benefits and alternatives. The patient understands, agrees and consents for the procedure. All questions were addressed. A time out was performed prior to the initiation of the procedure. Maximal barrier sterile technique utilized including caps, mask, sterile gowns, sterile gloves, large sterile drape, hand hygiene, and Betadine prep. The right common femoral artery was interrogated with ultrasound and found to be widely patent. An image was obtained and stored for the medical record. Local anesthesia was attained by infiltration with 1% lidocaine. A small dermatotomy was made. Under real-time  sonographic guidance, the vessel was punctured with a 21 gauge micropuncture needle. Using standard technique, the initial micro needle was exchanged  over a 0.018 micro wire for a transitional 4 [place] micro sheath. The micro sheath was then exchanged over a 0.035 wire for a 5 French vascular sheath. A C2 cobra catheter was advanced over a Bentson wire into the abdominal aorta. Despite multiple attempts, the origin of the celiac artery could not be catheterized. The origin of the superior mesenteric artery was catheterized. Arteriography was performed. The inferior pancreaticoduodenal arcade is hypertrophic and provides robust flow retrograde up the gastroduodenal arteries supplying the right a proper hepatic artery. 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 There is a small vessel stump in the region of the inferior genu of the gastroduodenal artery concerning for and injured/recently bleeding artery. A renegade ST microcatheter was advanced over a Fathom 16 wire into a branch off the superior mesenteric artery. Arteriography demonstrates anatomy most consistent with the middle colic artery. There is no communication with the gastroduodenal artery. The microcatheter was brought back into the main superior mesenteric artery. The catheter was then successfully navigated into the inferior pancreaticoduodenal artery. Arteriography was performed in multiple obliquities confirming the anatomy of the inferior pancreatic 0 duodenal artery, its anastomosis with the right gastroepiploic artery and right gastroduodenal artery. Again, there is a small beak of an artery consistent with an injured vessel. This appears to be in the expected location of the descending duodenum. The microcatheter was successfully navigated into the gastroduodenal artery. Contrast extravasation in the region of the abnormal branch artery demonstrates active extravasation. The microcatheter was successfully navigated beyond the injured artery into the gastroduodenal artery more proximal to the proper hepatic artery. Coil embolization was then performed of the distal  segment of the gastroduodenal artery across the origin of the injured arterial branch vessel using a series of 3, 4 and 5 mm fibered and non fibered interlock detachable microcoils. Care was taken to make sure the coil pack did not extend across the origin of the right gastroepiploic artery. Post embolization arteriography demonstrates complete cessation of flow within that segment of the gastroduodenal artery, no further extravasation from the injured artery and preservation of flow through the gastroepiploic artery. The microcatheter and 5 French catheters were removed. A limited right common femoral arteriogram was performed confirming common femoral arterial access. Of note, the patient has a high bifurcation. Hemostasis was attained with the assistance of a 5 [place] Cordis ExoSeal extra arterial vascular plug. IMPRESSION: 1. Evidence of arterial injury/active bleeding arising from a branch vessel from the distal gastroduodenal artery. 2. Successful coil embolization of the distal segment of the gastroduodenal artery including the origin of the injured arterial branch. Signed, Sterling Big, MD, RPVI [facility] Radiology Electronically Signed   By: Malachy Moan M.D.   On: 05/18/2018 17:17   Ir Angiogram Selective Each Additional Vessel  Result Date: 05/18/2018 EXAM: SELECTIVE VISCERAL ARTERIOGRAPHY; ADDITIONAL ARTERIOGRAPHY; IR EMBO ART VEN HEMORR LYMPH EXTRAV INC GUIDE ROADMAPPING; IR ULTRASOUND GUIDANCE VASC ACCESS RIGHT MEDICATIONS: None ANESTHESIA/SEDATION: Moderate (conscious) sedation was employed during this procedure. A total of Versed 1.5 mg and Fentanyl 50 mcg was administered intravenously. Moderate Sedation Time: 62 minutes. The patient's level of consciousness and vital signs were monitored continuously by radiology nursing throughout the procedure under my direct supervision. CONTRAST:  20mL ISOVUE-300 IOPAMIDOL (ISOVUE-300)  INJECTION 61%, 70mL ISOVUE-300 IOPAMIDOL (ISOVUE-300) INJECTION 61% FLUOROSCOPY TIME:  Fluoroscopy Time: 20 minutes 0 seconds (1913 mGy). COMPLICATIONS: None immediate. PROCEDURE: Informed consent was obtained from the patient following explanation of the procedure, risks, benefits and alternatives. The patient understands, agrees and consents for the procedure. All questions were addressed. A time out was performed prior to the initiation of the procedure. Maximal barrier sterile technique utilized including caps, mask, sterile gowns, sterile gloves, large sterile drape, hand hygiene, and Betadine prep. The right common femoral artery was interrogated with ultrasound and found to be widely patent. An image was obtained and stored for the medical record. Local anesthesia was attained by infiltration with 1% lidocaine. A small dermatotomy was made. Under real-time sonographic guidance, the vessel was punctured with a 21 gauge micropuncture needle. Using standard technique, the initial micro needle was exchanged over a 0.018 micro wire for a transitional 4 [place] micro sheath. The micro sheath was then exchanged over a 0.035 wire for a 5 French vascular sheath. A C2 cobra catheter was advanced over a Bentson wire into the abdominal aorta. Despite multiple attempts, the origin of the celiac artery could not be catheterized. The origin of the superior mesenteric artery was catheterized. Arteriography was performed. The inferior pancreaticoduodenal arcade is hypertrophic and provides robust flow retrograde up the gastroduodenal arteries supplying the right a proper hepatic artery. 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 There is a small vessel stump in the region of the inferior genu of the gastroduodenal artery concerning for and injured/recently bleeding artery. A renegade ST microcatheter was advanced over a Fathom 16 wire into a branch off the superior mesenteric artery. Arteriography  demonstrates anatomy most consistent with the middle colic artery. There is no communication with the gastroduodenal artery. The microcatheter was brought back into the main superior mesenteric artery. The catheter was then successfully navigated into the inferior pancreaticoduodenal artery. Arteriography was performed in multiple obliquities confirming the anatomy of the inferior pancreatic 0 duodenal artery, its anastomosis with the right gastroepiploic artery and right gastroduodenal artery. Again, there is a small beak of an artery consistent with an injured vessel. This appears to be in the expected location of the descending duodenum. The microcatheter was successfully navigated into the gastroduodenal artery. Contrast extravasation in the region of the abnormal branch artery demonstrates active extravasation. The microcatheter was successfully navigated beyond the injured artery into the gastroduodenal artery more proximal to the proper hepatic artery. Coil embolization was then performed of the distal segment of the gastroduodenal artery across the origin of the injured arterial branch vessel using a series of 3, 4 and 5 mm fibered and non fibered interlock detachable microcoils. Care was taken to make sure the coil pack did not extend across the origin of the right gastroepiploic artery. Post embolization arteriography demonstrates complete cessation of flow within that segment of the gastroduodenal artery, no further extravasation from the injured artery and preservation of flow through the gastroepiploic artery. The microcatheter and 5 French catheters were removed. A limited right common femoral arteriogram was performed confirming common femoral arterial access. Of note, the patient has a high bifurcation. Hemostasis was attained with the assistance of a 5 [place] Cordis ExoSeal extra arterial vascular plug. IMPRESSION: 1. Evidence of arterial injury/active bleeding arising from a branch vessel from the  distal gastroduodenal artery. 2. Successful coil embolization of the distal segment of the gastroduodenal artery including the origin of the injured arterial branch. Signed, Sterling Big, MD, RPVI [facility] Radiology Electronically  Signed   By: Malachy Moan M.D.   On: 05/18/2018 17:17   Ir Angiogram Selective Each Additional Vessel  Result Date: 05/18/2018 EXAM: SELECTIVE VISCERAL ARTERIOGRAPHY; ADDITIONAL ARTERIOGRAPHY; IR EMBO ART VEN HEMORR LYMPH EXTRAV INC GUIDE ROADMAPPING; IR ULTRASOUND GUIDANCE VASC ACCESS RIGHT MEDICATIONS: None ANESTHESIA/SEDATION: Moderate (conscious) sedation was employed during this procedure. A total of Versed 1.5 mg and Fentanyl 50 mcg was administered intravenously. Moderate Sedation Time: 62 minutes. The patient's level of consciousness and vital signs were monitored continuously by radiology nursing throughout the procedure under my direct supervision. CONTRAST:  20mL ISOVUE-300 IOPAMIDOL (ISOVUE-300) INJECTION 61%, 70mL ISOVUE-300 IOPAMIDOL (ISOVUE-300) INJECTION 61% FLUOROSCOPY TIME:  Fluoroscopy Time: 20 minutes 0 seconds (1913 mGy). COMPLICATIONS: None immediate. PROCEDURE: Informed consent was obtained from the patient following explanation of the procedure, risks, benefits and alternatives. The patient understands, agrees and consents for the procedure. All questions were addressed. A time out was performed prior to the initiation of the procedure. Maximal barrier sterile technique utilized including caps, mask, sterile gowns, sterile gloves, large sterile drape, hand hygiene, and Betadine prep. The right common femoral artery was interrogated with ultrasound and found to be widely patent. An image was obtained and stored for the medical record. Local anesthesia was attained by infiltration with 1% lidocaine. A small dermatotomy was made. Under real-time sonographic guidance, the vessel was punctured with a 21  gauge micropuncture needle. Using standard technique, the initial micro needle was exchanged over a 0.018 micro wire for a transitional 4 [place] micro sheath. The micro sheath was then exchanged over a 0.035 wire for a 5 French vascular sheath. A C2 cobra catheter was advanced over a Bentson wire into the abdominal aorta. Despite multiple attempts, the origin of the celiac artery could not be catheterized. The origin of the superior mesenteric artery was catheterized. Arteriography was performed. The inferior pancreaticoduodenal arcade is hypertrophic and provides robust flow retrograde up the gastroduodenal arteries supplying the right a proper hepatic artery. 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 There is a small vessel stump in the region of the inferior genu of the gastroduodenal artery concerning for and injured/recently bleeding artery. A renegade ST microcatheter was advanced over a Fathom 16 wire into a branch off the superior mesenteric artery. Arteriography demonstrates anatomy most consistent with the middle colic artery. There is no communication with the gastroduodenal artery. The microcatheter was brought back into the main superior mesenteric artery. The catheter was then successfully navigated into the inferior pancreaticoduodenal artery. Arteriography was performed in multiple obliquities confirming the anatomy of the inferior pancreatic 0 duodenal artery, its anastomosis with the right gastroepiploic artery and right gastroduodenal artery. Again, there is a small beak of an artery consistent with an injured vessel. This appears to be in the expected location of the descending duodenum. The microcatheter was successfully navigated into the gastroduodenal artery. Contrast extravasation in the region of the abnormal branch artery demonstrates active extravasation. The microcatheter was successfully navigated beyond the injured artery into the gastroduodenal artery  more proximal to the proper hepatic artery. Coil embolization was then performed of the distal segment of the gastroduodenal artery across the origin of the injured arterial branch vessel using a series of 3, 4 and 5 mm fibered and non fibered interlock detachable microcoils. Care was taken to make sure the coil pack did not extend across the origin of the right gastroepiploic artery. Post embolization arteriography demonstrates complete cessation of flow within that segment of  the gastroduodenal artery, no further extravasation from the injured artery and preservation of flow through the gastroepiploic artery. The microcatheter and 5 French catheters were removed. A limited right common femoral arteriogram was performed confirming common femoral arterial access. Of note, the patient has a high bifurcation. Hemostasis was attained with the assistance of a 5 [place] Cordis ExoSeal extra arterial vascular plug. IMPRESSION: 1. Evidence of arterial injury/active bleeding arising from a branch vessel from the distal gastroduodenal artery. 2. Successful coil embolization of the distal segment of the gastroduodenal artery including the origin of the injured arterial branch. Signed, Sterling Big, MD, RPVI [facility] Radiology Electronically Signed   By: Malachy Moan M.D.   On: 05/18/2018 17:17   Ir Angiogram Selective Each Additional Vessel  Result Date: 05/18/2018 EXAM: SELECTIVE VISCERAL ARTERIOGRAPHY; ADDITIONAL ARTERIOGRAPHY; IR EMBO ART VEN HEMORR LYMPH EXTRAV INC GUIDE ROADMAPPING; IR ULTRASOUND GUIDANCE VASC ACCESS RIGHT MEDICATIONS: None ANESTHESIA/SEDATION: Moderate (conscious) sedation was employed during this procedure. A total of Versed 1.5 mg and Fentanyl 50 mcg was administered intravenously. Moderate Sedation Time: 62 minutes. The patient's level of consciousness and vital signs were monitored continuously by radiology nursing throughout the  procedure under my direct supervision. CONTRAST:  20mL ISOVUE-300 IOPAMIDOL (ISOVUE-300) INJECTION 61%, 70mL ISOVUE-300 IOPAMIDOL (ISOVUE-300) INJECTION 61% FLUOROSCOPY TIME:  Fluoroscopy Time: 20 minutes 0 seconds (1913 mGy). COMPLICATIONS: None immediate. PROCEDURE: Informed consent was obtained from the patient following explanation of the procedure, risks, benefits and alternatives. The patient understands, agrees and consents for the procedure. All questions were addressed. A time out was performed prior to the initiation of the procedure. Maximal barrier sterile technique utilized including caps, mask, sterile gowns, sterile gloves, large sterile drape, hand hygiene, and Betadine prep. The right common femoral artery was interrogated with ultrasound and found to be widely patent. An image was obtained and stored for the medical record. Local anesthesia was attained by infiltration with 1% lidocaine. A small dermatotomy was made. Under real-time sonographic guidance, the vessel was punctured with a 21 gauge micropuncture needle. Using standard technique, the initial micro needle was exchanged over a 0.018 micro wire for a transitional 4 [place] micro sheath. The micro sheath was then exchanged over a 0.035 wire for a 5 French vascular sheath. A C2 cobra catheter was advanced over a Bentson wire into the abdominal aorta. Despite multiple attempts, the origin of the celiac artery could not be catheterized. The origin of the superior mesenteric artery was catheterized. Arteriography was performed. The inferior pancreaticoduodenal arcade is hypertrophic and provides robust flow retrograde up the gastroduodenal arteries supplying the right a proper hepatic artery. 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 There is a small vessel stump in the region of the inferior genu of the gastroduodenal artery concerning for and injured/recently bleeding artery. A renegade ST microcatheter was  advanced over a Fathom 16 wire into a branch off the superior mesenteric artery. Arteriography demonstrates anatomy most consistent with the middle colic artery. There is no communication with the gastroduodenal artery. The microcatheter was brought back into the main superior mesenteric artery. The catheter was then successfully navigated into the inferior pancreaticoduodenal artery. Arteriography was performed in multiple obliquities confirming the anatomy of the inferior pancreatic 0 duodenal artery, its anastomosis with the right gastroepiploic artery and right gastroduodenal artery. Again, there is a small beak of an artery consistent with an injured vessel. This appears to be in the expected location of the  descending duodenum. The microcatheter was successfully navigated into the gastroduodenal artery. Contrast extravasation in the region of the abnormal branch artery demonstrates active extravasation. The microcatheter was successfully navigated beyond the injured artery into the gastroduodenal artery more proximal to the proper hepatic artery. Coil embolization was then performed of the distal segment of the gastroduodenal artery across the origin of the injured arterial branch vessel using a series of 3, 4 and 5 mm fibered and non fibered interlock detachable microcoils. Care was taken to make sure the coil pack did not extend across the origin of the right gastroepiploic artery. Post embolization arteriography demonstrates complete cessation of flow within that segment of the gastroduodenal artery, no further extravasation from the injured artery and preservation of flow through the gastroepiploic artery. The microcatheter and 5 French catheters were removed. A limited right common femoral arteriogram was performed confirming common femoral arterial access. Of note, the patient has a high bifurcation. Hemostasis was attained with the assistance of a 5 [place] Cordis ExoSeal extra arterial vascular plug.  IMPRESSION: 1. Evidence of arterial injury/active bleeding arising from a branch vessel from the distal gastroduodenal artery. 2. Successful coil embolization of the distal segment of the gastroduodenal artery including the origin of the injured arterial branch. Signed, Sterling Big, MD, RPVI [facility] Radiology Electronically Signed   By: Malachy Moan M.D.   On: 05/18/2018 17:17   Ir US Guide Vasc Access Right  Result Date: 05/18/2018 EXAM: SELECTIVE VISCERAL ARTERIOGRAPHY; ADDITIONAL ARTERIOGRAPHY; IR EMBO ART VEN HEMORR LYMPH EXTRAV INC GUIDE ROADMAPPING; IR ULTRASOUND GUIDANCE VASC ACCESS RIGHT MEDICATIONS: None ANESTHESIA/SEDATION: Moderate (conscious) sedation was employed during this procedure. A total of Versed 1.5 mg and Fentanyl 50 mcg was administered intravenously. Moderate Sedation Time: 62 minutes. The patient's level of consciousness and vital signs were monitored continuously by radiology nursing throughout the procedure under my direct supervision. CONTRAST:  20mL ISOVUE-300 IOPAMIDOL (ISOVUE-300) INJECTION 61%, 70mL ISOVUE-300 IOPAMIDOL (ISOVUE-300) INJECTION 61% FLUOROSCOPY TIME:  Fluoroscopy Time: 20 minutes 0 seconds (1913 mGy). COMPLICATIONS: None immediate. PROCEDURE: Informed consent was obtained from the patient following explanation of the procedure, risks, benefits and alternatives. The patient understands, agrees and consents for the procedure. All questions were addressed. A time out was performed prior to the initiation of the procedure. Maximal barrier sterile technique utilized including caps, mask, sterile gowns, sterile gloves, large sterile drape, hand hygiene, and Betadine prep. The right common femoral artery was interrogated with ultrasound and found to be widely patent. An image was obtained and stored for the medical record. Local anesthesia was attained by infiltration with 1% lidocaine. A small dermatotomy  was made. Under real-time sonographic guidance, the vessel was punctured with a 21 gauge micropuncture needle. Using standard technique, the initial micro needle was exchanged over a 0.018 micro wire for a transitional 4 [place] micro sheath. The micro sheath was then exchanged over a 0.035 wire for a 5 French vascular sheath. A C2 cobra catheter was advanced over a Bentson wire into the abdominal aorta. Despite multiple attempts, the origin of the celiac artery could not be catheterized. The origin of the superior mesenteric artery was catheterized. Arteriography was performed. The inferior pancreaticoduodenal arcade is hypertrophic and provides robust flow retrograde up the gastroduodenal arteries supplying the right a proper hepatic artery. 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 There is a small vessel stump in the region of the inferior genu of the gastroduodenal artery concerning for  and injured/recently bleeding artery. A renegade ST microcatheter was advanced over a Fathom 16 wire into a branch off the superior mesenteric artery. Arteriography demonstrates anatomy most consistent with the middle colic artery. There is no communication with the gastroduodenal artery. The microcatheter was brought back into the main superior mesenteric artery. The catheter was then successfully navigated into the inferior pancreaticoduodenal artery. Arteriography was performed in multiple obliquities confirming the anatomy of the inferior pancreatic 0 duodenal artery, its anastomosis with the right gastroepiploic artery and right gastroduodenal artery. Again, there is a small beak of an artery consistent with an injured vessel. This appears to be in the expected location of the descending duodenum. The microcatheter was successfully navigated into the gastroduodenal artery. Contrast extravasation in the region of the abnormal branch artery demonstrates active extravasation. The microcatheter was  successfully navigated beyond the injured artery into the gastroduodenal artery more proximal to the proper hepatic artery. Coil embolization was then performed of the distal segment of the gastroduodenal artery across the origin of the injured arterial branch vessel using a series of 3, 4 and 5 mm fibered and non fibered interlock detachable microcoils. Care was taken to make sure the coil pack did not extend across the origin of the right gastroepiploic artery. Post embolization arteriography demonstrates complete cessation of flow within that segment of the gastroduodenal artery, no further extravasation from the injured artery and preservation of flow through the gastroepiploic artery. The microcatheter and 5 French catheters were removed. A limited right common femoral arteriogram was performed confirming common femoral arterial access. Of note, the patient has a high bifurcation. Hemostasis was attained with the assistance of a 5 [place] Cordis ExoSeal extra arterial vascular plug. IMPRESSION: 1. Evidence of arterial injury/active bleeding arising from a branch vessel from the distal gastroduodenal artery. 2. Successful coil embolization of the distal segment of the gastroduodenal artery including the origin of the injured arterial branch. Signed, Sterling Big, MD, RPVI [facility] Electronically Signed   By: Malachy Moan M.D.   On: 05/18/2018 17:17   Dg Chest Port 1 View  Result Date: 05/18/2018 CLINICAL DATA:  Shortness of breath, weakness, stumbling, abdominal pain for 5 days, dark-colored stools, history of peptic ulcers, smoker EXAM: PORTABLE CHEST 1 VIEW COMPARISON:  Portable exam 0846 hours compared 04/24/2018 FINDINGS: Minimal enlargement of cardiac silhouette. Atherosclerotic calcification aorta. Mediastinal contours and pulmonary vascularity normal. Minimal subsegmental atelectasis at LEFT costophrenic angle. Lungs otherwise  clear. No infiltrate, pleural effusion or pneumothorax. Bones demineralized. IMPRESSION: Minimal LEFT basilar subsegmental atelectasis. Electronically Signed   By: Ulyses Southward M.D.   On: 05/18/2018 09:22   Ir Embo Art  Peter [facility] Guide Roadmapping  Result Date: 05/18/2018 EXAM: SELECTIVE VISCERAL ARTERIOGRAPHY; ADDITIONAL ARTERIOGRAPHY; IR EMBO ART VEN HEMORR LYMPH EXTRAV INC GUIDE ROADMAPPING; IR ULTRASOUND GUIDANCE VASC ACCESS RIGHT MEDICATIONS: None ANESTHESIA/SEDATION: Moderate (conscious) sedation was employed during this procedure. A total of Versed 1.5 mg and Fentanyl 50 mcg was administered intravenously. Moderate Sedation Time: 62 minutes. The patient's level of consciousness and vital signs were monitored continuously by radiology nursing throughout the procedure under my direct supervision. CONTRAST:  20mL ISOVUE-300 IOPAMIDOL (ISOVUE-300) INJECTION 61%, 70mL ISOVUE-300 IOPAMIDOL (ISOVUE-300) INJECTION 61% FLUOROSCOPY TIME:  Fluoroscopy Time: 20 minutes 0 seconds (1913 mGy). COMPLICATIONS: None immediate. PROCEDURE: Informed consent was obtained from the patient following explanation of the procedure, risks, benefits and alternatives. The patient understands, agrees and consents for the procedure. All questions  were addressed. A time out was performed prior to the initiation of the procedure. Maximal barrier sterile technique utilized including caps, mask, sterile gowns, sterile gloves, large sterile drape, hand hygiene, and Betadine prep. The right common femoral artery was interrogated with ultrasound and found to be widely patent. An image was obtained and stored for the medical record. Local anesthesia was attained by infiltration with 1% lidocaine. A small dermatotomy was made. Under real-time sonographic guidance, the vessel was punctured with a 21 gauge micropuncture needle. Using standard technique, the initial micro needle was exchanged over a 0.018 micro wire for a  transitional 4 [place] micro sheath. The micro sheath was then exchanged over a 0.035 wire for a 5 French vascular sheath. A C2 cobra catheter was advanced over a Bentson wire into the abdominal aorta. Despite multiple attempts, the origin of the celiac artery could not be catheterized. The origin of the superior mesenteric artery was catheterized. Arteriography was performed. The inferior pancreaticoduodenal arcade is hypertrophic and provides robust flow retrograde up the gastroduodenal arteries supplying the right a proper hepatic artery. 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 There is a small vessel stump in the region of the inferior genu of the gastroduodenal artery concerning for and injured/recently bleeding artery. A renegade ST microcatheter was advanced over a Fathom 16 wire into a branch off the superior mesenteric artery. Arteriography demonstrates anatomy most consistent with the middle colic artery. There is no communication with the gastroduodenal artery. The microcatheter was brought back into the main superior mesenteric artery. The catheter was then successfully navigated into the inferior pancreaticoduodenal artery. Arteriography was performed in multiple obliquities confirming the anatomy of the inferior pancreatic 0 duodenal artery, its anastomosis with the right gastroepiploic artery and right gastroduodenal artery. Again, there is a small beak of an artery consistent with an injured vessel. This appears to be in the expected location of the descending duodenum. The microcatheter was successfully navigated into the gastroduodenal artery. Contrast extravasation in the region of the abnormal branch artery demonstrates active extravasation. The microcatheter was successfully navigated beyond the injured artery into the gastroduodenal artery more proximal to the proper hepatic artery. Coil embolization was then performed of the distal segment of the gastroduodenal  artery across the origin of the injured arterial branch vessel using a series of 3, 4 and 5 mm fibered and non fibered interlock detachable microcoils. Care was taken to make sure the coil pack did not extend across the origin of the right gastroepiploic artery. Post embolization arteriography demonstrates complete cessation of flow within that segment of the gastroduodenal artery, no further extravasation from the injured artery and preservation of flow through the gastroepiploic artery. The microcatheter and 5 French catheters were removed. A limited right common femoral arteriogram was performed confirming common femoral arterial access. Of note, the patient has a high bifurcation. Hemostasis was attained with the assistance of a 5 [place] Cordis ExoSeal extra arterial vascular plug. IMPRESSION: 1. Evidence of arterial injury/active bleeding arising from a branch vessel from the distal gastroduodenal artery. 2. Successful coil embolization of the distal segment of the gastroduodenal artery including the origin of the injured arterial branch. Signed, Sterling Big, MD, RPVI [facility] Radiology Electronically Signed   By: Malachy Moan M.D.   On: 05/18/2018 17:17     Medical Consultants:    None.  Anti-Infectives:   none  Subjective:    Walter Nicholson that he does  not feel good continues to have melanotic stools some abdominal discomfort.  Objective:    Vitals:   05/19/18 2345 05/20/18 0000 05/20/18 0200 05/20/18 0400  BP:  (!) 162/64 (!) 146/75   Pulse:  62 61   Resp:  14 18   Temp: 99 F (37.2 C)   98.4 F (36.9 C)  TempSrc: Oral   Oral  SpO2:  98% 99%   Weight:      Height:        Intake/Output Summary (Last 24 hours) at 05/20/2018 0713 Last data filed at 05/20/2018 0500 Gross per 24 hour  Intake 1290 ml  Output 3400 ml  Net -2110 ml   Filed Weights   05/18/18 1323  Weight: 56.7 kg    Exam: General  exam: In no acute distress. Respiratory system: Good air movement and clear to auscultation. Cardiovascular system: S1 & S2 heard, RRR.   Gastrointestinal system: Positive bowel sounds soft, with epigastric tenderness no rebound or guarding. Central nervous system: Alert and oriented. No focal neurological deficits. Extremities: No pedal edema. Skin: No rashes, lesions or ulcers Psychiatry: Judgement and insight appear normal. Mood & affect appropriate.    Data Reviewed:    Labs: Basic Metabolic Panel: Recent Labs  Lab 05/18/18 0827 05/18/18 0840 05/19/18 0344  NA 142  --  141  K 2.9*  --  3.6  CL 104  --  109  CO2 28  --  24  GLUCOSE 111*  --  110*  BUN 18  --  12  CREATININE 0.94  --  0.63  CALCIUM 8.7*  --  8.2*  MG  --  2.2  --    GFR Estimated Creatinine Clearance: 71.9 mL/min (by C-G formula based on SCr of 0.63 mg/dL). Liver Function Tests: Recent Labs  Lab 05/18/18 0827 05/19/18 0344  AST 47* 19  ALT 34 20  ALKPHOS 137* 85  BILITOT 0.6 0.6  PROT 6.5 5.1*  ALBUMIN 3.4* 2.6*   No results for input(s): LIPASE, AMYLASE in the last 168 hours. No results for input(s): AMMONIA in the last 168 hours. Coagulation profile Recent Labs  Lab 05/18/18 0827  INR 0.97    CBC: Recent Labs  Lab 05/18/18 0827  05/19/18 0344 05/19/18 1052 05/19/18 1648 05/19/18 2253 05/20/18 0107  WBC 8.2  --  6.5  --   --   --   --   NEUTROABS 6.4  --   --   --   --   --   --   HGB 7.3*   < > 7.4* 7.4* 7.1* 7.0* 7.0*  HCT 23.8*   < > 23.2* 22.9* 21.2* 21.8* 22.0*  MCV 82.6  --  86.9  --   --   --   --   PLT 341  --  201  --   --   --   --    < > = values in this interval not displayed.   Cardiac Enzymes: No results for input(s): CKTOTAL, CKMB, CKMBINDEX, TROPONINI in the last 168 hours. BNP (last 3 results) No results for input(s): PROBNP in the last 8760 hours. CBG: No results for input(s): GLUCAP in the last 168 hours. D-Dimer: No results for input(s): DDIMER in the  last 72 hours. Hgb A1c: No results for input(s): HGBA1C in the last 72 hours. Lipid Profile: No results for input(s): CHOL, HDL, LDLCALC, TRIG, CHOLHDL, LDLDIRECT in the last 72 hours. Thyroid function studies: No results for input(s): TSH, T4TOTAL, T3FREE,  THYROIDAB in the last 72 hours.  Invalid input(s): FREET3 Anemia work up: No results for input(s): VITAMINB12, FOLATE, FERRITIN, TIBC, IRON, RETICCTPCT in the last 72 hours. Sepsis Labs: Recent Labs  Lab 05/18/18 0827 05/19/18 0344  WBC 8.2 6.5   Microbiology Recent Results (from the past 240 hour(s))  MRSA PCR Screening     Status: None   Collection Time: 05/18/18  1:17 PM  Result Value Ref Range Status   MRSA by PCR NEGATIVE NEGATIVE Final    Comment:        The GeneXpert MRSA Assay (FDA approved for NASAL specimens only), is one component of a comprehensive MRSA colonization surveillance program. It is not intended to diagnose MRSA infection nor to guide or monitor treatment for MRSA infections. Performed at [facility], [address] [place], [place] [address]      Medications:   . sodium chloride   Intravenous Once  . nicotine  21 mg Transdermal Daily  . [START ON 05/21/2018] pantoprazole  40 mg Intravenous Q12H   Continuous Infusions: . octreotide  (SANDOSTATIN)    IV infusion 25 mcg/hr (05/20/18 0137)  . pantoprozole (PROTONIX) infusion 8 mg/hr (05/20/18 0312)     LOS: 2 days   Marinda Elk  [facility] Pager [phone]  *Please refer to [email or web address], password [email or web address] to get updated schedule on who will round on this patient, as hospitalists switch teams weekly. If 7PM-7AM, please contact night-coverage at [email or web address], password [email or web address] for any overnight needs.  05/20/2018, 7:13 AM

## 2018-05-20 NOTE — Progress Notes (Signed)
Subjective: Minimal diarrhea. No further blood in stool. Abdominal pain improving.  Objective: Vital signs in last 24 hours: Temp:  [97.5 F (36.4 C)-99.6 F (37.6 C)] 97.5 F (36.4 C) (10/13 1125) Pulse Rate:  [61-82] 80 (10/13 1000) Resp:  [10-19] 16 (10/13 1000) BP: (146-188)/(63-80) 188/72 (10/13 1000) SpO2:  [98 %-100 %] 100 % (10/13 1000) Weight change:  Last BM Date: 05/19/18  PE: GEN:  Cachectic, chronically ill-appearing, but NAD ABD:  Soft, non-tender  Lab Results: CBC    Component Value Date/Time   WBC 6.5 05/19/2018 0344   RBC 2.67 (L) 05/19/2018 0344   HGB 7.3 (L) 05/20/2018 0653   HCT 22.8 (L) 05/20/2018 0653   PLT 201 05/19/2018 0344   MCV 86.9 05/19/2018 0344   MCH 27.7 05/19/2018 0344   MCHC 31.9 05/19/2018 0344   RDW 17.1 (H) 05/19/2018 0344   LYMPHSABS 0.9 05/18/2018 0827   MONOABS 0.8 05/18/2018 0827   EOSABS 0.1 05/18/2018 0827   BASOSABS 0.0 05/18/2018 0827   CMP     Component Value Date/Time   NA 141 05/19/2018 0344   K 3.6 05/19/2018 0344   CL 109 05/19/2018 0344   CO2 24 05/19/2018 0344   GLUCOSE 110 (H) 05/19/2018 0344   BUN 12 05/19/2018 0344   CREATININE 0.63 05/19/2018 0344   CALCIUM 8.2 (L) 05/19/2018 0344   PROT 5.1 (L) 05/19/2018 0344   ALBUMIN 2.6 (L) 05/19/2018 0344   AST 19 05/19/2018 0344   ALT 20 05/19/2018 0344   ALKPHOS 85 05/19/2018 0344   BILITOT 0.6 05/19/2018 0344   GFRNONAA >60 05/19/2018 0344   GFRAA >60 05/19/2018 0344   Assessment:  1.  Melena, improving. 2.  Anemia, acute blood loss, persistent, re-equilibration? 3.  Duodenal ulcer, large with large overlying clot, no endoscopic therapy possible, GDA embolization by Interventional Radiology done couple days ago.  Plan:  1.  PPI gtt another day, maybe transition to oral formulation tomorrow. 2.  Full liquid diet ok. 3.  [facility] will follow.   Walter Nicholson 05/20/2018, 11:38 AM   Cell [phone] If no answer or after 5 PM call  [phone]

## 2018-05-20 NOTE — Progress Notes (Signed)
Initial Nutrition Assessment  INTERVENTION:   -Provide Boost Breeze po TID, each supplement provides 250 kcal and 9 grams of protein -Once diet is advanced, provide Ensure Enlive po BID, each supplement provides 350 kcal and 20 grams of protein  NUTRITION DIAGNOSIS:   Increased nutrient needs related to (ETOH abuse, esophagitis) as evidenced by estimated needs.  GOAL:   Patient will meet greater than or equal to 90% of their needs  MONITOR:   PO intake, Supplement acceptance, Diet advancement, Labs, Weight trends, I & O's  REASON FOR ASSESSMENT:   Malnutrition Screening Tool    ASSESSMENT:    67 y.o. male past medical history of depression bipolar disorder alcohol abuse presents with fatigue lightheadedness and black stool, he has been taking Aleve and Voltaren for the last week  Patient reporting continued poor appetite and 20 lb of weight loss over the past few months. Per weight records, pt has lost 3 lb since 9/17. Given weight loss, poor appetite and ETOH abuse, pt would benefit from nutritional supplements. Will order Boost Breeze while on clear liquids. Recommend Ensure supplements once diet is advanced. Per GI note, EGD showed esophagitis. Pt did report difficulty swallowing with some pain PTA.  Labs reviewed. Medications reviewed.  NUTRITION - FOCUSED PHYSICAL EXAM:  Deferred.  Diet Order:   Diet Order            Diet clear liquid Room service appropriate? Yes; Fluid consistency: Thin  Diet effective now              EDUCATION NEEDS:   No education needs have been identified at this time  Skin:  Skin Assessment: Reviewed RN Assessment  Last BM:  10/12  Height:   Ht Readings from Last 1 Encounters:  05/18/18 5\' 6"  (1.676 m)    Weight:   Wt Readings from Last 1 Encounters:  05/18/18 56.7 kg    Ideal Body Weight:  64.5 kg  BMI:  Body mass index is 20.18 kg/m.  Estimated Nutritional Needs:   Kcal:  1700-1900  Protein:  85-95g  Fluid:   1.9L/day  Tilda Franco, MS, RD, LDN [facility] Inpatient Clinical Dietitian Pager: [phone] After Hours Pager: [phone]

## 2018-05-21 LAB — TYPE AND SCREEN
ABO/RH(D): A POS
ANTIBODY SCREEN: NEGATIVE
UNIT DIVISION: 0
Unit division: 0
Unit division: 0

## 2018-05-21 LAB — BPAM RBC
BLOOD PRODUCT EXPIRATION DATE: 201911012359
BLOOD PRODUCT EXPIRATION DATE: 201911012359
BLOOD PRODUCT EXPIRATION DATE: 201911022359
ISSUE DATE / TIME: 201910111748
ISSUE DATE / TIME: 201910110942
ISSUE DATE / TIME: 201910130803
UNIT TYPE AND RH: 6200
Unit Type and Rh: 6200
Unit Type and Rh: 6200

## 2018-05-21 LAB — HEMOGLOBIN AND HEMATOCRIT, BLOOD
HEMATOCRIT: 27.5 % — AB (ref 39.0–52.0)
HEMATOCRIT: 27.4 % — AB (ref 39.0–52.0)
HEMOGLOBIN: 9 g/dL — AB (ref 13.0–17.0)
HEMOGLOBIN: 9.1 g/dL — AB (ref 13.0–17.0)

## 2018-05-21 MED ORDER — LEVOTHYROXINE SODIUM 75 MCG PO TABS
75.0000 ug | ORAL_TABLET | Freq: Every day | ORAL | Status: DC
  Administered 2018-05-21 – 2018-05-23 (×3): 75 ug via ORAL
  Filled 2018-05-21 (×3): qty 1

## 2018-05-21 MED ORDER — ACETAMINOPHEN 500 MG PO TABS
500.0000 mg | ORAL_TABLET | Freq: Four times a day (QID) | ORAL | Status: DC | PRN
  Administered 2018-05-21: 500 mg via ORAL
  Filled 2018-05-21: qty 1

## 2018-05-21 MED ORDER — SUCRALFATE 1 G PO TABS
1.0000 g | ORAL_TABLET | Freq: Three times a day (TID) | ORAL | Status: DC
  Administered 2018-05-21 – 2018-05-23 (×9): 1 g via ORAL
  Filled 2018-05-21 (×9): qty 1

## 2018-05-21 MED ORDER — GABAPENTIN 400 MG PO CAPS
800.0000 mg | ORAL_CAPSULE | Freq: Three times a day (TID) | ORAL | Status: DC
  Administered 2018-05-21 – 2018-05-23 (×7): 800 mg via ORAL
  Filled 2018-05-21 (×7): qty 2

## 2018-05-21 MED ORDER — LISINOPRIL 20 MG PO TABS
20.0000 mg | ORAL_TABLET | Freq: Every day | ORAL | Status: DC
  Administered 2018-05-21 – 2018-05-23 (×3): 20 mg via ORAL
  Filled 2018-05-21 (×3): qty 1

## 2018-05-21 MED ORDER — TRAMADOL HCL 50 MG PO TABS: 50.0000 mg | ORAL_TABLET | Freq: Three times a day (TID) | ORAL | Status: DC | PRN

## 2018-05-21 MED ORDER — LAMOTRIGINE 100 MG PO TABS
200.0000 mg | ORAL_TABLET | Freq: Two times a day (BID) | ORAL | Status: DC
  Administered 2018-05-21 – 2018-05-23 (×5): 200 mg via ORAL
  Filled 2018-05-21 (×5): qty 2

## 2018-05-21 MED ORDER — PANTOPRAZOLE SODIUM 40 MG PO TBEC
40.0000 mg | DELAYED_RELEASE_TABLET | Freq: Two times a day (BID) | ORAL | Status: DC
  Administered 2018-05-21 – 2018-05-23 (×5): 40 mg via ORAL
  Filled 2018-05-21 (×5): qty 1

## 2018-05-21 NOTE — Progress Notes (Signed)
[facility] PROGRESS NOTE    Progress Note  Walter Nicholson  ZOX:[id number] DOB: 1951-05-17 DOA: 05/18/2018 PCP: Patient, No Pcp Per     Brief Narrative:   Walter Nicholson is an 67 y.o. male past medical history of depression bipolar disorder alcohol abuse presents with fatigue lightheadedness and black stool, he has been taking Aleve and Voltaren for the last week  Assessment/Plan:   Duodenal ulcer with hemorrhage/GI bleed/Hemorrhagic shock (HCC) Hemoglobin of September was 10.7 on admission is 7.3. GI was consulted who recommended an EGD 05/19/2011 that showed severe erosive esophagitis with large duodenal ulcer with visible vessel. IR was consulted for embolization on GDA on 05/18/2018. He has been transfused a total of 3 units.  Hemoglobin today 9.0 Transition to oral PPI allow full liquid diet discontinue telemetry get physical therapy and transfer to MedSurg. No further bloody bowel movements.  Essential hypertension: Resume antihypertensive medication  Mild elevation  Transaminitis: Resolved.  Bipolar I disorder, severe, current or most recent episode depressed, with melancholic features (HCC) Resume his psychotropic medication.  History of alcohol abuse He relates his last drink was more than 2 months ago no signs of withdrawal.  Hypokalemia: Resolved  DVT prophylaxis: scd Family Communication:none Disposition Plan/Barrier to D/C: Transfer to MedSurg Code Status:     Code Status Orders  (From admission, onward)         Start     Ordered   05/18/18 1257  Full code  Continuous     05/18/18 1256        Code Status History    Date Active Date Inactive Code Status Order ID Comments User Context   05/02/2018 1949 05/03/2018 1240 Full Code [id number]  Arby Barrette, MD ED   04/24/2018 1737 05/01/2018 1822 Full Code [id number]  Truman Hayward, FNP Inpatient   04/24/2018 1737 04/24/2018 1737 Full Code [id number]  Truman Hayward, FNP Inpatient   04/22/2018 0958 04/24/2018 1705 Full Code [id number]  Rolan Bucco, MD ED        IV Access:    Peripheral IV   Procedures and diagnostic studies:   No results found.   Medical Consultants:    None.  Anti-Infectives:   none  Subjective:    Walter Nicholson no further melanotic stools or hematochezia no further bowel movements abdominal pain improved.  Objective:    Vitals:   05/21/18 0000 05/21/18 0200 05/21/18 0352 05/21/18 0400  BP: (!) 156/54 (!) 139/53  (!) 155/54  Pulse: 64 63  63  Resp:  (!) 22  14  Temp:   98.6 F (37 C)   TempSrc:   Oral   SpO2: 98% 98%  100%  Weight:      Height:        Intake/Output Summary (Last 24 hours) at 05/21/2018 0721 Last data filed at 05/21/2018 0300 Gross per 24 hour  Intake 675 ml  Output 2950 ml  Net -2275 ml   Filed Weights   05/18/18 1323  Weight: 56.7 kg    Exam: General exam: In no acute distress. Respiratory system: Good air movement and clear to auscultation. Cardiovascular system: S1 & S2 heard, RRR.   Gastrointestinal system: Positive bowel sounds soft, with epigastric tenderness no rebound or guarding. Central nervous system: Alert and oriented. No focal neurological deficits. Extremities: No pedal edema. Skin: No rashes, lesions or ulcers Psychiatry: Judgement and insight appear normal. Mood & affect appropriate.    Data Reviewed:    Labs: Basic Metabolic Panel:  Recent Labs  Lab 05/18/18 0827 05/18/18 0840 05/19/18 0344  NA 142  --  141  K 2.9*  --  3.6  CL 104  --  109  CO2 28  --  24  GLUCOSE 111*  --  110*  BUN 18  --  12  CREATININE 0.94  --  0.63  CALCIUM 8.7*  --  8.2*  MG  --  2.2  --    GFR Estimated Creatinine Clearance: 71.9 mL/min (by C-G formula based on SCr of 0.63 mg/dL). Liver Function Tests: Recent Labs  Lab 05/18/18 0827 05/19/18 0344  AST 47* 19  ALT 34 20  ALKPHOS 137* 85  BILITOT 0.6 0.6  PROT 6.5 5.1*  ALBUMIN 3.4* 2.6*   No results for input(s):  LIPASE, AMYLASE in the last 168 hours. No results for input(s): AMMONIA in the last 168 hours. Coagulation profile Recent Labs  Lab 05/18/18 0827  INR 0.97    CBC: Recent Labs  Lab 05/18/18 0827  05/19/18 0344  05/20/18 0653 05/20/18 1330 05/20/18 2005 05/21/18 0112 05/21/18 0641  WBC 8.2  --  6.5  --   --   --   --   --   --   NEUTROABS 6.4  --   --   --   --   --   --   --   --   HGB 7.3*   < > 7.4*   < > 7.3* 8.5* 8.6* 9.0* 9.1*  HCT 23.8*   < > 23.2*   < > 22.8* 25.5* 26.0* 27.5* 27.4*  MCV 82.6  --  86.9  --   --   --   --   --   --   PLT 341  --  201  --   --   --   --   --   --    < > = values in this interval not displayed.   Cardiac Enzymes: No results for input(s): CKTOTAL, CKMB, CKMBINDEX, TROPONINI in the last 168 hours. BNP (last 3 results) No results for input(s): PROBNP in the last 8760 hours. CBG: No results for input(s): GLUCAP in the last 168 hours. D-Dimer: No results for input(s): DDIMER in the last 72 hours. Hgb A1c: No results for input(s): HGBA1C in the last 72 hours. Lipid Profile: No results for input(s): CHOL, HDL, LDLCALC, TRIG, CHOLHDL, LDLDIRECT in the last 72 hours. Thyroid function studies: No results for input(s): TSH, T4TOTAL, T3FREE, THYROIDAB in the last 72 hours.  Invalid input(s): FREET3 Anemia work up: No results for input(s): VITAMINB12, FOLATE, FERRITIN, TIBC, IRON, RETICCTPCT in the last 72 hours. Sepsis Labs: Recent Labs  Lab 05/18/18 0827 05/19/18 0344  WBC 8.2 6.5   Microbiology Recent Results (from the past 240 hour(s))  MRSA PCR Screening     Status: None   Collection Time: 05/18/18  1:17 PM  Result Value Ref Range Status   MRSA by PCR NEGATIVE NEGATIVE Final    Comment:        The GeneXpert MRSA Assay (FDA approved for NASAL specimens only), is one component of a comprehensive MRSA colonization surveillance program. It is not intended to diagnose MRSA infection nor to guide or monitor treatment for MRSA  infections. Performed at [facility], [address] [place], [place] [address]      Medications:   . sodium chloride   Intravenous Once  . feeding supplement  1 Container Oral TID BM  . nicotine  21 mg Transdermal Daily  . pantoprazole  40 mg Intravenous Q12H   Continuous Infusions: . octreotide  (SANDOSTATIN)    IV infusion 25 mcg/hr (05/21/18 0437)  . pantoprozole (PROTONIX) infusion 8 mg/hr (05/21/18 0134)     LOS: 3 days   Marinda Elk  [facility] Pager [phone]  *Please refer to [email or web address], password [email or web address] to get updated schedule on who will round on this patient, as hospitalists switch teams weekly. If 7PM-7AM, please contact night-coverage at [email or web address], password [email or web address] for any overnight needs.  05/21/2018, 7:21 AM

## 2018-05-21 NOTE — Progress Notes (Signed)
Subjective: Few dark stools.  Objective: Vital signs in last 24 hours: Temp:  [97.4 F (36.3 C)-98.6 F (37 C)] 98 F (36.7 C) (10/14 0902) Pulse Rate:  [63-84] 84 (10/14 0902) Resp:  [9-22] 18 (10/14 0902) BP: (138-167)/(53-90) 138/80 (10/14 0937) SpO2:  [98 %-100 %] 100 % (10/14 0902) Weight change:  Last BM Date: 05/21/18  PE: GEN:  NAD, fatigued ABD:  Soft, generalized soreness, no peritonitis  Lab Results: CBC    Component Value Date/Time   WBC 6.5 05/19/2018 0344   RBC 2.67 (L) 05/19/2018 0344   HGB 9.1 (L) 05/21/2018 0641   HCT 27.4 (L) 05/21/2018 0641   PLT 201 05/19/2018 0344   MCV 86.9 05/19/2018 0344   MCH 27.7 05/19/2018 0344   MCHC 31.9 05/19/2018 0344   RDW 17.1 (H) 05/19/2018 0344   LYMPHSABS 0.9 05/18/2018 0827   MONOABS 0.8 05/18/2018 0827   EOSABS 0.1 05/18/2018 0827   BASOSABS 0.0 05/18/2018 0827    Assessment:  1. Melena, improving.  Suspect dark stool is old blood passing through. 2. Anemia, acute blood loss, improving. 3. Duodenal ulcer, large with large overlying clot, no endoscopic therapy possible, GDA embolization by Interventional Radiology done couple days ago.  Plan:  1.  Continue PPI, full liquid diet. 2.  If ongoing dark stools, may have to consider repeat endoscopy to reassess. 3.  [facility] will follow.  May need PT evaluation?   Walter Nicholson 05/21/2018, 11:00 AM   Cell [phone] If no answer or after 5 PM call [phone]

## 2018-05-21 NOTE — Care Management Important Message (Signed)
Important Message  Patient Details  Name: Walter Nicholson MRN: [id number] Date of Birth: 22-Feb-1951   Medicare Important Message Given:  Yes    Caren Macadam 05/21/2018, 11:15 AMImportant Message  Patient Details  Name: Walter Nicholson MRN: [id number] Date of Birth: October 18, 1950   Medicare Important Message Given:  Yes    Caren Macadam 05/21/2018, 11:15 AM

## 2018-05-22 MED ORDER — TRAMADOL HCL 50 MG PO TABS: 50.0000 mg | ORAL_TABLET | Freq: Three times a day (TID) | ORAL | Status: DC | PRN

## 2018-05-22 NOTE — Progress Notes (Signed)
[facility] PROGRESS NOTE    Progress Note  Walter Nicholson  ZOX:[id number] DOB: [birth year] DOA: 05/18/2018 PCP: Patient, No Pcp Per     Brief Narrative:   Walter Nicholson is an 67 y.o. male past medical history of depression bipolar disorder alcohol abuse presents with fatigue lightheadedness and black stool, he has been taking Aleve and Voltaren for the last week  Assessment/Plan:   Duodenal ulcer with hemorrhage/GI bleed/Hemorrhagic shock (HCC) Hemoglobin of September was 10.7 on admission is 7.3. GI was consulted who recommended an EGD 05/19/2011 that showed severe erosive esophagitis with large duodenal ulcer with visible vessel. IR was consulted for embolization on GDA on 05/18/2018. He has been transfused a total of 3 units.  Hemoglobin today 9.0 Continue oral PPI, appreciate GIs assistance we will continue to monitor hemoglobin has remained stable He is tolerating his diet.  Will advance to regular diet.  Essential hypertension: Resume antihypertensive medication  Mild elevation  Transaminitis: Resolved.  Bipolar I disorder, severe, current or most recent episode depressed, with melancholic features (HCC) Resume his psychotropic medication.  History of alcohol abuse He relates his last drink was more than 2 months ago no signs of withdrawal.  Hypokalemia: Resolved  DVT prophylaxis: scd Family Communication:none Disposition Plan/Barrier to D/C: Home probably tomorrow. Code Status:     Code Status Orders  (From admission, onward)         Start     Ordered   05/18/18 1257  Full code  Continuous     05/18/18 1256        Code Status History    Date Active Date Inactive Code Status Order ID Comments User Context   05/02/2018 1949 05/03/2018 1240 Full Code [id number]  Arby Barrette, MD ED   04/24/2018 1737 05/01/2018 1822 Full Code [id number]  Truman Hayward, FNP Inpatient   04/24/2018 1737 04/24/2018 1737 Full Code [id number]  Truman Hayward, FNP  Inpatient   04/22/2018 0958 04/24/2018 1705 Full Code [id number]  Rolan Bucco, MD ED        IV Access:    Peripheral IV   Procedures and diagnostic studies:   No results found.   Medical Consultants:    None.  Anti-Infectives:   none  Subjective:    Walter Nicholson he relates that his stools are clearing.  Objective:    Vitals:   05/21/18 0902 05/21/18 0937 05/21/18 2004 05/22/18 0450  BP: (!) 158/90 138/80 (!) 151/76 115/61  Pulse: 84  83 93  Resp: 18  18 19   Temp: 98 F (36.7 C)  98.4 F (36.9 C) 98.2 F (36.8 C)  TempSrc: Oral  Oral Oral  SpO2: 100%  100% 99%  Weight:      Height:        Intake/Output Summary (Last 24 hours) at 05/22/2018 1059 Last data filed at 05/22/2018 0958 Gross per 24 hour  Intake 720 ml  Output -  Net 720 ml   Filed Weights   05/18/18 1323  Weight: 56.7 kg    Exam: General exam: In no acute distress. Respiratory system: Good air movement and clear to auscultation. Cardiovascular system: S1 & S2 heard, RRR.   Gastrointestinal system: Positive bowel sounds soft, no epigastric tenderness no rebound or guarding Central nervous system: Alert and oriented. No focal neurological deficits. Extremities: No pedal edema. Skin: No rashes, lesions or ulcers Psychiatry: Judgement and insight appear normal. Mood & affect appropriate.    Data Reviewed:    Labs: Basic  Metabolic Panel: Recent Labs  Lab 05/18/18 0827 05/18/18 0840 05/19/18 0344  NA 142  --  141  K 2.9*  --  3.6  CL 104  --  109  CO2 28  --  24  GLUCOSE 111*  --  110*  BUN 18  --  12  CREATININE 0.94  --  0.63  CALCIUM 8.7*  --  8.2*  MG  --  2.2  --    GFR Estimated Creatinine Clearance: 71.9 mL/min (by C-G formula based on SCr of 0.63 mg/dL). Liver Function Tests: Recent Labs  Lab 05/18/18 0827 05/19/18 0344  AST 47* 19  ALT 34 20  ALKPHOS 137* 85  BILITOT 0.6 0.6  PROT 6.5 5.1*  ALBUMIN 3.4* 2.6*   No results for input(s): LIPASE,  AMYLASE in the last 168 hours. No results for input(s): AMMONIA in the last 168 hours. Coagulation profile Recent Labs  Lab 05/18/18 0827  INR 0.97    CBC: Recent Labs  Lab 05/18/18 0827  05/19/18 0344  05/20/18 0653 05/20/18 1330 05/20/18 2005 05/21/18 0112 05/21/18 0641  WBC 8.2  --  6.5  --   --   --   --   --   --   NEUTROABS 6.4  --   --   --   --   --   --   --   --   HGB 7.3*   < > 7.4*   < > 7.3* 8.5* 8.6* 9.0* 9.1*  HCT 23.8*   < > 23.2*   < > 22.8* 25.5* 26.0* 27.5* 27.4*  MCV 82.6  --  86.9  --   --   --   --   --   --   PLT 341  --  201  --   --   --   --   --   --    < > = values in this interval not displayed.   Cardiac Enzymes: No results for input(s): CKTOTAL, CKMB, CKMBINDEX, TROPONINI in the last 168 hours. BNP (last 3 results) No results for input(s): PROBNP in the last 8760 hours. CBG: No results for input(s): GLUCAP in the last 168 hours. D-Dimer: No results for input(s): DDIMER in the last 72 hours. Hgb A1c: No results for input(s): HGBA1C in the last 72 hours. Lipid Profile: No results for input(s): CHOL, HDL, LDLCALC, TRIG, CHOLHDL, LDLDIRECT in the last 72 hours. Thyroid function studies: No results for input(s): TSH, T4TOTAL, T3FREE, THYROIDAB in the last 72 hours.  Invalid input(s): FREET3 Anemia work up: No results for input(s): VITAMINB12, FOLATE, FERRITIN, TIBC, IRON, RETICCTPCT in the last 72 hours. Sepsis Labs: Recent Labs  Lab 05/18/18 0827 05/19/18 0344  WBC 8.2 6.5   Microbiology Recent Results (from the past 240 hour(s))  MRSA PCR Screening     Status: None   Collection Time: 05/18/18  1:17 PM  Result Value Ref Range Status   MRSA by PCR NEGATIVE NEGATIVE Final    Comment:        The GeneXpert MRSA Assay (FDA approved for NASAL specimens only), is one component of a comprehensive MRSA colonization surveillance program. It is not intended to diagnose MRSA infection nor to guide or monitor treatment for MRSA  infections. Performed at [facility], [address], [place], [place] [address]      Medications:   . sodium chloride   Intravenous Once  . feeding supplement  1 Container Oral TID BM  .  gabapentin  800 mg Oral TID  . lamoTRIgine  200 mg Oral BID  . levothyroxine  75 mcg Oral QAC breakfast  . lisinopril  20 mg Oral Daily  . nicotine  21 mg Transdermal Daily  . pantoprazole  40 mg Oral BID  . sucralfate  1 g Oral TID WC & HS   Continuous Infusions:    LOS: 4 days   Marinda Elk  [facility] Pager [phone]  *Please refer to [email or web address], password [email or web address] to get updated schedule on who will round on this patient, as hospitalists switch teams weekly. If 7PM-7AM, please contact night-coverage at [email or web address], password [email or web address] for any overnight needs.  05/22/2018, 10:59 AM

## 2018-05-22 NOTE — Progress Notes (Signed)
Subjective: Having some loose and blood tinged stools.  Objective: Vital signs in last 24 hours: Temp:  [98.2 F (36.8 C)-98.4 F (36.9 C)] 98.2 F (36.8 C) (10/15 0450) Pulse Rate:  [83-93] 93 (10/15 0450) Resp:  [18-19] 19 (10/15 0450) BP: (115-151)/(61-76) 115/61 (10/15 0450) SpO2:  [99 %-100 %] 99 % (10/15 0450) Weight change:  Last BM Date: 05/21/18  PE: GEN:  NAD  Lab Results: CBC    Component Value Date/Time   WBC 6.5 05/19/2018 0344   RBC 2.67 (L) 05/19/2018 0344   HGB 9.1 (L) 05/21/2018 0641   HCT 27.4 (L) 05/21/2018 0641   PLT 201 05/19/2018 0344   MCV 86.9 05/19/2018 0344   MCH 27.7 05/19/2018 0344   MCHC 31.9 05/19/2018 0344   RDW 17.1 (H) 05/19/2018 0344   LYMPHSABS 0.9 05/18/2018 0827   MONOABS 0.8 05/18/2018 0827   EOSABS 0.1 05/18/2018 0827   BASOSABS 0.0 05/18/2018 0827   Assessment:  1. Melena, improving.  Suspect dark stool is old blood passing through. 2. Anemia, acute blood loss, stable. 3. Duodenal ulcer, large with large overlying clot, no endoscopic therapy possible, GDA embolization by Interventional Radiology donecouple days ago.  Plan:  1.  Continue PPI. 2.  Advance diet. 3.  Stool studies for diarrhea. 4.  Hgb stable, thus doubt ongoing bleeding, but if blood in stool persists for another day or two, would probably repeat endoscopy to reassess patient's duodenal ulcer. 5.  [facility] will follow.   Freddy Jaksch 05/22/2018, 10:44 AM   Cell [phone] If no answer or after 5 PM call [phone]

## 2018-05-23 MED ORDER — LEVOTHYROXINE SODIUM 75 MCG PO TABS: 75.0000 ug | ORAL_TABLET | Freq: Every day | ORAL | 0 refills | Status: AC

## 2018-05-23 MED ORDER — PANTOPRAZOLE SODIUM 40 MG PO TBEC: 40.0000 mg | DELAYED_RELEASE_TABLET | Freq: Two times a day (BID) | ORAL | 0 refills | Status: AC

## 2018-05-23 NOTE — Progress Notes (Signed)
Patient very restless, walking the halls, waiting for discharge papers.  He refused nicotine cessation education.  Stated he was leaving here to go get drunk and smoke a cigarette.  MD aware patient awaiting discharge.  Will continue to monitor. Levora Angel, RN

## 2018-05-23 NOTE — Discharge Summary (Addendum)
Physician Discharge Summary  Walter Nicholson  WGN:[id number]  DOB: June 10, 1951  DOA: 05/18/2018 PCP: Patient, No Pcp Per  Admit date: 05/18/2018 Discharge date: 05/23/2018  Admitted From: Home Disposition:  Home   Recommendations for Outpatient Follow-up:  1. Follow up with PCP in 1 weeks 2. Please obtain BMP/CBC in one week to monitor Hgb and renal function  3. GI follow up in 3-4 weeks   Discharge Condition: Stable  CODE STATUS: Full Code  Diet recommendation: Heart Healthy   Brief/Interim Summary: For full details see H&P/Progress note, but in brief, Walter Nicholson is a is a 67 year old male with past medical history of depression, bipolar disorder, alcohol abuse, hypothyroidism and hypertension who presented to the emergency department complaining of fatigue, lightheadedness and black stools in setting of NSAID use.  Upon ED evaluation hemoglobin was found to be 7.3, FOBT positive and patient was admitted with working diagnosis of GI bleed.  GI was consulted, patient underwent EGD which revealed duodenal ulcer with erosive esophagitis.  IR was consulted on embolization of GDA was done.  Patient subsequently was monitor and hemoglobin remained stable.  Treated with PPIs and advance diet as tolerated.  Upon discharge patient tolerating diet well, denies bloody bowel movement. Patient deemed stable for discharge to follow up as outpatient.   Subjective: Patient seen and examined, he is on his regular clothes and ready to go.  Patient reported no abdominal pain, bloody bowel movement or hematemesis.  Denies chest pain, shortness of breath and weakness.  Patient reports feeling back to his baseline.  Discharge Diagnoses/Hospital Course:  Duodenal ulcer with hemorrhage Felt to be related due to NSAIDs and alcohol abuse Hemoglobin upon admission 7.3, down from 10.7.  FOBT was positive.  GI was consulted and recommended EGD which was done on 10/11 and show severe erosive esophagitis  with large duodenal ulcer and visible vessel.  IR was consulted for embolization of GDA which was performed on 10/11.   Hemoglobin upon discharge 9.1.  Patient was treated with PPI and will continue this indefinitely.  He is tolerating diet.  Patient advised to avoid NSAIDs and alcohol.   Acute blood loss anemia Due to duodenal ulcer Patient was transfused total of 3 units of PRBC and hemoglobin has remained stable during hospital stay.  Check CBC in 1 week.  Will prescribe iron supplementation.  Essential hypertension BP stable during hospital stay Continue home antihypertensive medications with no changes  Mild transaminitis Resolved   Tobacco abuse  Cessation discussed  Hx of EtOH abuse Last alcohol use 2 months ago's, no signs of withdrawal symptoms during hospital stay.   Bipolar I disorder Stable.  Continue home medications.  All other chronic medical condition were stable during the hospitalization.  On the day of the discharge the patient's vitals were stable, and no other acute medical condition were reported by patient. the patient was felt safe to be discharge to home  Discharge Instructions  You were cared for by a hospitalist during your hospital stay. If you have any questions about your discharge medications or the care you received while you were in the hospital after you are discharged, you can call the unit and asked to speak with the hospitalist on call if the hospitalist that took care of you is not available. Once you are discharged, your primary care physician will handle any further medical issues. Please note that NO REFILLS for any discharge medications will be authorized once you are discharged, as it is imperative  that you return to your primary care physician (or establish a relationship with a primary care physician if you do not have one) for your aftercare needs so that they can reassess your need for medications and monitor your lab values.  Discharge  Instructions    Call MD for:  difficulty breathing, headache or visual disturbances   Complete by:  As directed    Call MD for:  extreme fatigue   Complete by:  As directed    Call MD for:  hives   Complete by:  As directed    Call MD for:  persistant dizziness or light-headedness   Complete by:  As directed    Call MD for:  persistant nausea and vomiting   Complete by:  As directed    Call MD for:  redness, tenderness, or signs of infection (pain, swelling, redness, odor or green/yellow discharge around incision site)   Complete by:  As directed    Call MD for:  severe uncontrolled pain   Complete by:  As directed    Call MD for:  temperature >100.4   Complete by:  As directed    Diet - low sodium heart healthy   Complete by:  As directed    Discharge instructions   Complete by:  As directed    Do not take any NSAID's (Aleve, Goody powders, ibuprofen, naproxen, Advil, etc.)   Increase activity slowly   Complete by:  As directed      Allergies as of 05/23/2018   No Known Allergies     Medication List    STOP taking these medications   diclofenac 75 MG EC tablet Commonly known as:  VOLTAREN   hydrOXYzine 50 MG tablet Commonly known as:  ATARAX/VISTARIL   naproxen sodium 220 MG tablet Commonly known as:  ALEVE     TAKE these medications   acetaminophen 500 MG tablet Commonly known as:  TYLENOL Take 500 mg by mouth every 6 (six) hours as needed for moderate pain.   citalopram 40 MG tablet Commonly known as:  CELEXA Take 1 tablet (40 mg total) by mouth daily. For depression   gabapentin 400 MG capsule Commonly known as:  NEURONTIN Take 2 capsules (800 mg total) by mouth 3 (three) times daily. For agitation   lamoTRIgine 200 MG tablet Commonly known as:  LAMICTAL Take 1 tablet (200 mg total) by mouth 2 (two) times daily. For mood stabilization   levothyroxine 75 MCG tablet Commonly known as:  SYNTHROID, LEVOTHROID Take 1 tablet (75 mcg total) by mouth daily  before breakfast. For thyroid hormone replacement   lisinopril 20 MG tablet Commonly known as:  PRINIVIL,ZESTRIL Take 1 tablet (20 mg total) by mouth daily. For high blood pressure   mirtazapine 30 MG tablet Commonly known as:  REMERON Take 1 tablet (30 mg total) by mouth at bedtime. For depression   multivitamin with minerals tablet Take 1 tablet by mouth daily.   nicotine 21 mg/24hr patch Commonly known as:  NICODERM CQ - dosed in mg/24 hours Place 1 patch (21 mg total) onto the skin daily. (May buy from over the counter): For nicotine withdrawal   pantoprazole 40 MG tablet Commonly known as:  PROTONIX Take 1 tablet (40 mg total) by mouth 2 (two) times daily. For acid reflux   sucralfate 1 g tablet Commonly known as:  CARAFATE Take 1 tablet (1 g total) by mouth 4 (four) times daily -  with meals and at bedtime. For Ulcer  traMADol 50 MG tablet Commonly known as:  ULTRAM Take 50 mg by mouth 3 (three) times daily as needed for moderate pain.       No Known Allergies  Consultations: GI - [facility]   Procedures/Studies: Dg Chest 2 View  Result Date: 04/24/2018 CLINICAL DATA:  Shortness of breath with exertion EXAM: CHEST - 2 VIEW COMPARISON:  None. FINDINGS: Heart is normal size. Mediastinal contours are within normal limits. Increased density is noted anteriorly on the lateral view in the retrosternal region. No visible confluent opacity on the frontal view. No effusions. Old rib fractures in the right lateral 10th and 11th ribs. IMPRESSION: Increased density noted anteriorly and inferiorly in the retrosternal space on the lateral view. This could be further evaluated with chest CT with IV contrast. Electronically Signed   By: Charlett Nose M.D.   On: 04/24/2018 10:35   Ir Angiogram Visceral Selective  Result Date: 05/18/2018 EXAM: SELECTIVE VISCERAL ARTERIOGRAPHY; ADDITIONAL ARTERIOGRAPHY; IR EMBO ART VEN HEMORR LYMPH EXTRAV INC GUIDE ROADMAPPING; IR ULTRASOUND GUIDANCE  VASC ACCESS RIGHT MEDICATIONS: None ANESTHESIA/SEDATION: Moderate (conscious) sedation was employed during this procedure. A total of Versed 1.5 mg and Fentanyl 50 mcg was administered intravenously. Moderate Sedation Time: 62 minutes. The patient's level of consciousness and vital signs were monitored continuously by radiology nursing throughout the procedure under my direct supervision. CONTRAST:  20mL ISOVUE-300 IOPAMIDOL (ISOVUE-300) INJECTION 61%, 70mL ISOVUE-300 IOPAMIDOL (ISOVUE-300) INJECTION 61% FLUOROSCOPY TIME:  Fluoroscopy Time: 20 minutes 0 seconds (1913 mGy). COMPLICATIONS: None immediate. PROCEDURE: Informed consent was obtained from the patient following explanation of the procedure, risks, benefits and alternatives. The patient understands, agrees and consents for the procedure. All questions were addressed. A time out was performed prior to the initiation of the procedure. Maximal barrier sterile technique utilized including caps, mask, sterile gowns, sterile gloves, large sterile drape, hand hygiene, and Betadine prep. The right common femoral artery was interrogated with ultrasound and found to be widely patent. An image was obtained and stored for the medical record. Local anesthesia was attained by infiltration with 1% lidocaine. A small dermatotomy was made. Under real-time sonographic guidance, the vessel was punctured with a 21 gauge micropuncture needle. Using standard technique, the initial micro needle was exchanged over a 0.018 micro wire for a transitional 4 [place] micro sheath. The micro sheath was then exchanged over a 0.035 wire for a 5 French vascular sheath. A C2 cobra catheter was advanced over a Bentson wire into the abdominal aorta. Despite multiple attempts, the origin of the celiac artery could not be catheterized. The origin of the superior mesenteric artery was catheterized. Arteriography was performed. The inferior pancreaticoduodenal arcade is hypertrophic and provides  robust flow retrograde up the gastroduodenal arteries supplying the right a proper hepatic artery. 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 There is a small vessel stump in the region of the inferior genu of the gastroduodenal artery concerning for and injured/recently bleeding artery. A renegade ST microcatheter was advanced over a Fathom 16 wire into a branch off the superior mesenteric artery. Arteriography demonstrates anatomy most consistent with the middle colic artery. There is no communication with the gastroduodenal artery. The microcatheter was brought back into the main superior mesenteric artery. The catheter was then successfully navigated into the inferior pancreaticoduodenal artery. Arteriography was performed in multiple obliquities confirming the anatomy of the inferior pancreatic 0 duodenal artery, its anastomosis with the right gastroepiploic artery and right gastroduodenal artery. Again, there is  a small beak of an artery consistent with an injured vessel. This appears to be in the expected location of the descending duodenum. The microcatheter was successfully navigated into the gastroduodenal artery. Contrast extravasation in the region of the abnormal branch artery demonstrates active extravasation. The microcatheter was successfully navigated beyond the injured artery into the gastroduodenal artery more proximal to the proper hepatic artery. Coil embolization was then performed of the distal segment of the gastroduodenal artery across the origin of the injured arterial branch vessel using a series of 3, 4 and 5 mm fibered and non fibered interlock detachable microcoils. Care was taken to make sure the coil pack did not extend across the origin of the right gastroepiploic artery. Post embolization arteriography demonstrates complete cessation of flow within that segment of the gastroduodenal artery, no further extravasation from the injured artery and  preservation of flow through the gastroepiploic artery. The microcatheter and 5 French catheters were removed. A limited right common femoral arteriogram was performed confirming common femoral arterial access. Of note, the patient has a high bifurcation. Hemostasis was attained with the assistance of a 5 [place] Cordis ExoSeal extra arterial vascular plug. IMPRESSION: 1. Evidence of arterial injury/active bleeding arising from a branch vessel from the distal gastroduodenal artery. 2. Successful coil embolization of the distal segment of the gastroduodenal artery including the origin of the injured arterial branch. Signed, Sterling Big, MD, RPVI [facility] Radiology Electronically Signed   By: Malachy Moan M.D.   On: 05/18/2018 17:17   Ir Angiogram Selective Each Additional Vessel  Result Date: 05/18/2018 EXAM: SELECTIVE VISCERAL ARTERIOGRAPHY; ADDITIONAL ARTERIOGRAPHY; IR EMBO ART VEN HEMORR LYMPH EXTRAV INC GUIDE ROADMAPPING; IR ULTRASOUND GUIDANCE VASC ACCESS RIGHT MEDICATIONS: None ANESTHESIA/SEDATION: Moderate (conscious) sedation was employed during this procedure. A total of Versed 1.5 mg and Fentanyl 50 mcg was administered intravenously. Moderate Sedation Time: 62 minutes. The patient's level of consciousness and vital signs were monitored continuously by radiology nursing throughout the procedure under my direct supervision. CONTRAST:  20mL ISOVUE-300 IOPAMIDOL (ISOVUE-300) INJECTION 61%, 70mL ISOVUE-300 IOPAMIDOL (ISOVUE-300) INJECTION 61% FLUOROSCOPY TIME:  Fluoroscopy Time: 20 minutes 0 seconds (1913 mGy). COMPLICATIONS: None immediate. PROCEDURE: Informed consent was obtained from the patient following explanation of the procedure, risks, benefits and alternatives. The patient understands, agrees and consents for the procedure. All questions were addressed. A time out was performed prior to the initiation of the procedure. Maximal  barrier sterile technique utilized including caps, mask, sterile gowns, sterile gloves, large sterile drape, hand hygiene, and Betadine prep. The right common femoral artery was interrogated with ultrasound and found to be widely patent. An image was obtained and stored for the medical record. Local anesthesia was attained by infiltration with 1% lidocaine. A small dermatotomy was made. Under real-time sonographic guidance, the vessel was punctured with a 21 gauge micropuncture needle. Using standard technique, the initial micro needle was exchanged over a 0.018 micro wire for a transitional 4 [place] micro sheath. The micro sheath was then exchanged over a 0.035 wire for a 5 French vascular sheath. A C2 cobra catheter was advanced over a Bentson wire into the abdominal aorta. Despite multiple attempts, the origin of the celiac artery could not be catheterized. The origin of the superior mesenteric artery was catheterized. Arteriography was performed. The inferior pancreaticoduodenal arcade is hypertrophic and provides robust flow retrograde up the gastroduodenal arteries supplying the right a proper hepatic artery. This is an indication that the origin of the celiac artery  is occluded. There is a small vessel stump in the region of the inferior genu of the gastroduodenal artery concerning for and injured/recently bleeding artery. A renegade ST microcatheter was advanced over a Fathom 16 wire into a branch off the superior mesenteric artery. Arteriography demonstrates anatomy most consistent with the middle colic artery. There is no communication with the gastroduodenal artery. The microcatheter was brought back into the main superior mesenteric artery. The catheter was then successfully navigated into the inferior pancreaticoduodenal artery. Arteriography was performed in multiple obliquities confirming the anatomy of the inferior pancreatic 0 duodenal artery, its anastomosis with the right gastroepiploic artery and  right gastroduodenal artery. Again, there is a small beak of an artery consistent with an injured vessel. This appears to be in the expected location of the descending duodenum. The microcatheter was successfully navigated into the gastroduodenal artery. Contrast extravasation in the region of the abnormal branch artery demonstrates active extravasation. The microcatheter was successfully navigated beyond the injured artery into the gastroduodenal artery more proximal to the proper hepatic artery. Coil embolization was then performed of the distal segment of the gastroduodenal artery across the origin of the injured arterial branch vessel using a series of 3, 4 and 5 mm fibered and non fibered interlock detachable microcoils. Care was taken to make sure the coil pack did not extend across the origin of the right gastroepiploic artery. Post embolization arteriography demonstrates complete cessation of flow within that segment of the gastroduodenal artery, no further extravasation from the injured artery and preservation of flow through the gastroepiploic artery. The microcatheter and 5 French catheters were removed. A limited right common femoral arteriogram was performed confirming common femoral arterial access. Of note, the patient has a high bifurcation. Hemostasis was attained with the assistance of a 5 [place] Cordis ExoSeal extra arterial vascular plug. IMPRESSION: 1. Evidence of arterial injury/active bleeding arising from a branch vessel from the distal gastroduodenal artery. 2. Successful coil embolization of the distal segment of the gastroduodenal artery including the origin of the injured arterial branch. Signed, Sterling Big, MD, RPVI [facility] Radiology Electronically Signed   By: Malachy Moan M.D.   On: 05/18/2018 17:17   Ir Angiogram Selective Each Additional Vessel  Result Date: 05/18/2018 EXAM: SELECTIVE VISCERAL ARTERIOGRAPHY;  ADDITIONAL ARTERIOGRAPHY; IR EMBO ART VEN HEMORR LYMPH EXTRAV INC GUIDE ROADMAPPING; IR ULTRASOUND GUIDANCE VASC ACCESS RIGHT MEDICATIONS: None ANESTHESIA/SEDATION: Moderate (conscious) sedation was employed during this procedure. A total of Versed 1.5 mg and Fentanyl 50 mcg was administered intravenously. Moderate Sedation Time: 62 minutes. The patient's level of consciousness and vital signs were monitored continuously by radiology nursing throughout the procedure under my direct supervision. CONTRAST:  20mL ISOVUE-300 IOPAMIDOL (ISOVUE-300) INJECTION 61%, 70mL ISOVUE-300 IOPAMIDOL (ISOVUE-300) INJECTION 61% FLUOROSCOPY TIME:  Fluoroscopy Time: 20 minutes 0 seconds (1913 mGy). COMPLICATIONS: None immediate. PROCEDURE: Informed consent was obtained from the patient following explanation of the procedure, risks, benefits and alternatives. The patient understands, agrees and consents for the procedure. All questions were addressed. A time out was performed prior to the initiation of the procedure. Maximal barrier sterile technique utilized including caps, mask, sterile gowns, sterile gloves, large sterile drape, hand hygiene, and Betadine prep. The right common femoral artery was interrogated with ultrasound and found to be widely patent. An image was obtained and stored for the medical record. Local anesthesia was attained by infiltration with 1% lidocaine. A small dermatotomy was made. Under real-time sonographic guidance, the vessel was punctured with  a 21 gauge micropuncture needle. Using standard technique, the initial micro needle was exchanged over a 0.018 micro wire for a transitional 4 [place] micro sheath. The micro sheath was then exchanged over a 0.035 wire for a 5 French vascular sheath. A C2 cobra catheter was advanced over a Bentson wire into the abdominal aorta. Despite multiple attempts, the origin of the celiac artery could not be catheterized. The origin of the superior mesenteric artery was  catheterized. Arteriography was performed. The inferior pancreaticoduodenal arcade is hypertrophic and provides robust flow retrograde up the gastroduodenal arteries supplying the right a proper hepatic artery. 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 There is a small vessel stump in the region of the inferior genu of the gastroduodenal artery concerning for and injured/recently bleeding artery. A renegade ST microcatheter was advanced over a Fathom 16 wire into a branch off the superior mesenteric artery. Arteriography demonstrates anatomy most consistent with the middle colic artery. There is no communication with the gastroduodenal artery. The microcatheter was brought back into the main superior mesenteric artery. The catheter was then successfully navigated into the inferior pancreaticoduodenal artery. Arteriography was performed in multiple obliquities confirming the anatomy of the inferior pancreatic 0 duodenal artery, its anastomosis with the right gastroepiploic artery and right gastroduodenal artery. Again, there is a small beak of an artery consistent with an injured vessel. This appears to be in the expected location of the descending duodenum. The microcatheter was successfully navigated into the gastroduodenal artery. Contrast extravasation in the region of the abnormal branch artery demonstrates active extravasation. The microcatheter was successfully navigated beyond the injured artery into the gastroduodenal artery more proximal to the proper hepatic artery. Coil embolization was then performed of the distal segment of the gastroduodenal artery across the origin of the injured arterial branch vessel using a series of 3, 4 and 5 mm fibered and non fibered interlock detachable microcoils. Care was taken to make sure the coil pack did not extend across the origin of the right gastroepiploic artery. Post embolization arteriography demonstrates complete cessation of flow  within that segment of the gastroduodenal artery, no further extravasation from the injured artery and preservation of flow through the gastroepiploic artery. The microcatheter and 5 French catheters were removed. A limited right common femoral arteriogram was performed confirming common femoral arterial access. Of note, the patient has a high bifurcation. Hemostasis was attained with the assistance of a 5 [place] Cordis ExoSeal extra arterial vascular plug. IMPRESSION: 1. Evidence of arterial injury/active bleeding arising from a branch vessel from the distal gastroduodenal artery. 2. Successful coil embolization of the distal segment of the gastroduodenal artery including the origin of the injured arterial branch. Signed, Sterling Big, MD, RPVI [facility] Radiology Electronically Signed   By: Malachy Moan M.D.   On: 05/18/2018 17:17   Ir Angiogram Selective Each Additional Vessel  Result Date: 05/18/2018 EXAM: SELECTIVE VISCERAL ARTERIOGRAPHY; ADDITIONAL ARTERIOGRAPHY; IR EMBO ART VEN HEMORR LYMPH EXTRAV INC GUIDE ROADMAPPING; IR ULTRASOUND GUIDANCE VASC ACCESS RIGHT MEDICATIONS: None ANESTHESIA/SEDATION: Moderate (conscious) sedation was employed during this procedure. A total of Versed 1.5 mg and Fentanyl 50 mcg was administered intravenously. Moderate Sedation Time: 62 minutes. The patient's level of consciousness and vital signs were monitored continuously by radiology nursing throughout the procedure under my direct supervision. CONTRAST:  20mL ISOVUE-300 IOPAMIDOL (ISOVUE-300) INJECTION 61%, 70mL ISOVUE-300 IOPAMIDOL (ISOVUE-300) INJECTION 61% FLUOROSCOPY TIME:  Fluoroscopy Time: 20 minutes 0 seconds (1913  mGy). COMPLICATIONS: None immediate. PROCEDURE: Informed consent was obtained from the patient following explanation of the procedure, risks, benefits and alternatives. The patient understands, agrees and consents for the procedure. All  questions were addressed. A time out was performed prior to the initiation of the procedure. Maximal barrier sterile technique utilized including caps, mask, sterile gowns, sterile gloves, large sterile drape, hand hygiene, and Betadine prep. The right common femoral artery was interrogated with ultrasound and found to be widely patent. An image was obtained and stored for the medical record. Local anesthesia was attained by infiltration with 1% lidocaine. A small dermatotomy was made. Under real-time sonographic guidance, the vessel was punctured with a 21 gauge micropuncture needle. Using standard technique, the initial micro needle was exchanged over a 0.018 micro wire for a transitional 4 [place] micro sheath. The micro sheath was then exchanged over a 0.035 wire for a 5 French vascular sheath. A C2 cobra catheter was advanced over a Bentson wire into the abdominal aorta. Despite multiple attempts, the origin of the celiac artery could not be catheterized. The origin of the superior mesenteric artery was catheterized. Arteriography was performed. The inferior pancreaticoduodenal arcade is hypertrophic and provides robust flow retrograde up the gastroduodenal arteries supplying the right a proper hepatic artery. 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 There is a small vessel stump in the region of the inferior genu of the gastroduodenal artery concerning for and injured/recently bleeding artery. A renegade ST microcatheter was advanced over a Fathom 16 wire into a branch off the superior mesenteric artery. Arteriography demonstrates anatomy most consistent with the middle colic artery. There is no communication with the gastroduodenal artery. The microcatheter was brought back into the main superior mesenteric artery. The catheter was then successfully navigated into the inferior pancreaticoduodenal artery. Arteriography was performed in multiple obliquities confirming the anatomy  of the inferior pancreatic 0 duodenal artery, its anastomosis with the right gastroepiploic artery and right gastroduodenal artery. Again, there is a small beak of an artery consistent with an injured vessel. This appears to be in the expected location of the descending duodenum. The microcatheter was successfully navigated into the gastroduodenal artery. Contrast extravasation in the region of the abnormal branch artery demonstrates active extravasation. The microcatheter was successfully navigated beyond the injured artery into the gastroduodenal artery more proximal to the proper hepatic artery. Coil embolization was then performed of the distal segment of the gastroduodenal artery across the origin of the injured arterial branch vessel using a series of 3, 4 and 5 mm fibered and non fibered interlock detachable microcoils. Care was taken to make sure the coil pack did not extend across the origin of the right gastroepiploic artery. Post embolization arteriography demonstrates complete cessation of flow within that segment of the gastroduodenal artery, no further extravasation from the injured artery and preservation of flow through the gastroepiploic artery. The microcatheter and 5 French catheters were removed. A limited right common femoral arteriogram was performed confirming common femoral arterial access. Of note, the patient has a high bifurcation. Hemostasis was attained with the assistance of a 5 [place] Cordis ExoSeal extra arterial vascular plug. IMPRESSION: 1. Evidence of arterial injury/active bleeding arising from a branch vessel from the distal gastroduodenal artery. 2. Successful coil embolization of the distal segment of the gastroduodenal artery including the origin of the injured arterial branch. Signed, Sterling Big, MD, RPVI [facility] Radiology Electronically Signed   By: Malachy Moan  M.D.   On: 05/18/2018 17:17   Ir US Guide  Vasc Access Right  Result Date: 05/18/2018 EXAM: SELECTIVE VISCERAL ARTERIOGRAPHY; ADDITIONAL ARTERIOGRAPHY; IR EMBO ART VEN HEMORR LYMPH EXTRAV INC GUIDE ROADMAPPING; IR ULTRASOUND GUIDANCE VASC ACCESS RIGHT MEDICATIONS: None ANESTHESIA/SEDATION: Moderate (conscious) sedation was employed during this procedure. A total of Versed 1.5 mg and Fentanyl 50 mcg was administered intravenously. Moderate Sedation Time: 62 minutes. The patient's level of consciousness and vital signs were monitored continuously by radiology nursing throughout the procedure under my direct supervision. CONTRAST:  20mL ISOVUE-300 IOPAMIDOL (ISOVUE-300) INJECTION 61%, 70mL ISOVUE-300 IOPAMIDOL (ISOVUE-300) INJECTION 61% FLUOROSCOPY TIME:  Fluoroscopy Time: 20 minutes 0 seconds (1913 mGy). COMPLICATIONS: None immediate. PROCEDURE: Informed consent was obtained from the patient following explanation of the procedure, risks, benefits and alternatives. The patient understands, agrees and consents for the procedure. All questions were addressed. A time out was performed prior to the initiation of the procedure. Maximal barrier sterile technique utilized including caps, mask, sterile gowns, sterile gloves, large sterile drape, hand hygiene, and Betadine prep. The right common femoral artery was interrogated with ultrasound and found to be widely patent. An image was obtained and stored for the medical record. Local anesthesia was attained by infiltration with 1% lidocaine. A small dermatotomy was made. Under real-time sonographic guidance, the vessel was punctured with a 21 gauge micropuncture needle. Using standard technique, the initial micro needle was exchanged over a 0.018 micro wire for a transitional 4 [place] micro sheath. The micro sheath was then exchanged over a 0.035 wire for a 5 French vascular sheath. A C2 cobra catheter was advanced over a Bentson wire into the abdominal aorta. Despite multiple attempts, the origin of the celiac  artery could not be catheterized. The origin of the superior mesenteric artery was catheterized. Arteriography was performed. The inferior pancreaticoduodenal arcade is hypertrophic and provides robust flow retrograde up the gastroduodenal arteries supplying the right a proper hepatic artery. 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 There is a small vessel stump in the region of the inferior genu of the gastroduodenal artery concerning for and injured/recently bleeding artery. A renegade ST microcatheter was advanced over a Fathom 16 wire into a branch off the superior mesenteric artery. Arteriography demonstrates anatomy most consistent with the middle colic artery. There is no communication with the gastroduodenal artery. The microcatheter was brought back into the main superior mesenteric artery. The catheter was then successfully navigated into the inferior pancreaticoduodenal artery. Arteriography was performed in multiple obliquities confirming the anatomy of the inferior pancreatic 0 duodenal artery, its anastomosis with the right gastroepiploic artery and right gastroduodenal artery. Again, there is a small beak of an artery consistent with an injured vessel. This appears to be in the expected location of the descending duodenum. The microcatheter was successfully navigated into the gastroduodenal artery. Contrast extravasation in the region of the abnormal branch artery demonstrates active extravasation. The microcatheter was successfully navigated beyond the injured artery into the gastroduodenal artery more proximal to the proper hepatic artery. Coil embolization was then performed of the distal segment of the gastroduodenal artery across the origin of the injured arterial branch vessel using a series of 3, 4 and 5 mm fibered and non fibered interlock detachable microcoils. Care was taken to make sure the coil pack did not extend across the origin of the right gastroepiploic  artery. Post embolization arteriography demonstrates complete cessation of flow within that segment of the gastroduodenal artery, no further extravasation  from the injured artery and preservation of flow through the gastroepiploic artery. The microcatheter and 5 French catheters were removed. A limited right common femoral arteriogram was performed confirming common femoral arterial access. Of note, the patient has a high bifurcation. Hemostasis was attained with the assistance of a 5 [place] Cordis ExoSeal extra arterial vascular plug. IMPRESSION: 1. Evidence of arterial injury/active bleeding arising from a branch vessel from the distal gastroduodenal artery. 2. Successful coil embolization of the distal segment of the gastroduodenal artery including the origin of the injured arterial branch. Signed, Sterling Big, MD, RPVI [facility] Radiology Electronically Signed   By: Malachy Moan M.D.   On: 05/18/2018 17:17   Dg Chest Port 1 View  Result Date: 05/18/2018 CLINICAL DATA:  Shortness of breath, weakness, stumbling, abdominal pain for 5 days, dark-colored stools, history of peptic ulcers, smoker EXAM: PORTABLE CHEST 1 VIEW COMPARISON:  Portable exam 0846 hours compared 04/24/2018 FINDINGS: Minimal enlargement of cardiac silhouette. Atherosclerotic calcification aorta. Mediastinal contours and pulmonary vascularity normal. Minimal subsegmental atelectasis at LEFT costophrenic angle. Lungs otherwise clear. No infiltrate, pleural effusion or pneumothorax. Bones demineralized. IMPRESSION: Minimal LEFT basilar subsegmental atelectasis. Electronically Signed   By: Ulyses Southward M.D.   On: 05/18/2018 09:22   US Thyroid  Result Date: 04/29/2018 CLINICAL DATA:  Hyperthyroid.  Low TSH. EXAM: THYROID ULTRASOUND TECHNIQUE: Ultrasound examination of the thyroid gland and adjacent soft tissues was performed. COMPARISON:  None. FINDINGS: Parenchymal Echotexture:  Markedly heterogenous Isthmus: 0.3 cm Right lobe: 4.0 x 1.7 x 1.3 cm Left lobe: 3.2 x 1.4 x 1.1 cm _________________________________________________________ Estimated total number of nodules >/= 1 cm: 0 Number of spongiform nodules >/=  2 cm not described below (TR1): 0 Number of mixed cystic and solid nodules >/= 1.5 cm not described below (TR2): 0 _________________________________________________________ The thyroid gland is diffusely heterogeneous. No focal nodules identified. No abnormal lymph nodes seen by ultrasound. IMPRESSION: Diffusely heterogeneous thyroid gland without evidence of focal nodules. The above is in keeping with the ACR TI-RADS recommendations - J Am Coll Radiol 2017;14:587-595. Electronically Signed   By: Irish Lack M.D.   On: 04/29/2018 14:29   Ir Embo Art  Peter Minium Hemorr Lymph [employer] Guide Roadmapping  Result Date: 05/18/2018 EXAM: SELECTIVE VISCERAL ARTERIOGRAPHY; ADDITIONAL ARTERIOGRAPHY; IR EMBO ART VEN HEMORR LYMPH EXTRAV INC GUIDE ROADMAPPING; IR ULTRASOUND GUIDANCE VASC ACCESS RIGHT MEDICATIONS: None ANESTHESIA/SEDATION: Moderate (conscious) sedation was employed during this procedure. A total of Versed 1.5 mg and Fentanyl 50 mcg was administered intravenously. Moderate Sedation Time: 62 minutes. The patient's level of consciousness and vital signs were monitored continuously by radiology nursing throughout the procedure under my direct supervision. CONTRAST:  20mL ISOVUE-300 IOPAMIDOL (ISOVUE-300) INJECTION 61%, 70mL ISOVUE-300 IOPAMIDOL (ISOVUE-300) INJECTION 61% FLUOROSCOPY TIME:  Fluoroscopy Time: 20 minutes 0 seconds (1913 mGy). COMPLICATIONS: None immediate. PROCEDURE: Informed consent was obtained from the patient following explanation of the procedure, risks, benefits and alternatives. The patient understands, agrees and consents for the procedure. All questions were addressed. A time out was performed prior to the initiation of the procedure. Maximal barrier  sterile technique utilized including caps, mask, sterile gowns, sterile gloves, large sterile drape, hand hygiene, and Betadine prep. The right common femoral artery was interrogated with ultrasound and found to be widely patent. An image was obtained and stored for the medical record. Local anesthesia was attained by infiltration with 1% lidocaine. A small dermatotomy was made. Under real-time sonographic guidance, the vessel was  punctured with a 21 gauge micropuncture needle. Using standard technique, the initial micro needle was exchanged over a 0.018 micro wire for a transitional 4 [place] micro sheath. The micro sheath was then exchanged over a 0.035 wire for a 5 French vascular sheath. A C2 cobra catheter was advanced over a Bentson wire into the abdominal aorta. Despite multiple attempts, the origin of the celiac artery could not be catheterized. The origin of the superior mesenteric artery was catheterized. Arteriography was performed. The inferior pancreaticoduodenal arcade is hypertrophic and provides robust flow retrograde up the gastroduodenal arteries supplying the right a proper hepatic artery. 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 There is a small vessel stump in the region of the inferior genu of the gastroduodenal artery concerning for and injured/recently bleeding artery. A renegade ST microcatheter was advanced over a Fathom 16 wire into a branch off the superior mesenteric artery. Arteriography demonstrates anatomy most consistent with the middle colic artery. There is no communication with the gastroduodenal artery. The microcatheter was brought back into the main superior mesenteric artery. The catheter was then successfully navigated into the inferior pancreaticoduodenal artery. Arteriography was performed in multiple obliquities confirming the anatomy of the inferior pancreatic 0 duodenal artery, its anastomosis with the right gastroepiploic artery and right  gastroduodenal artery. Again, there is a small beak of an artery consistent with an injured vessel. This appears to be in the expected location of the descending duodenum. The microcatheter was successfully navigated into the gastroduodenal artery. Contrast extravasation in the region of the abnormal branch artery demonstrates active extravasation. The microcatheter was successfully navigated beyond the injured artery into the gastroduodenal artery more proximal to the proper hepatic artery. Coil embolization was then performed of the distal segment of the gastroduodenal artery across the origin of the injured arterial branch vessel using a series of 3, 4 and 5 mm fibered and non fibered interlock detachable microcoils. Care was taken to make sure the coil pack did not extend across the origin of the right gastroepiploic artery. Post embolization arteriography demonstrates complete cessation of flow within that segment of the gastroduodenal artery, no further extravasation from the injured artery and preservation of flow through the gastroepiploic artery. The microcatheter and 5 French catheters were removed. A limited right common femoral arteriogram was performed confirming common femoral arterial access. Of note, the patient has a high bifurcation. Hemostasis was attained with the assistance of a 5 [place] Cordis ExoSeal extra arterial vascular plug. IMPRESSION: 1. Evidence of arterial injury/active bleeding arising from a branch vessel from the distal gastroduodenal artery. 2. Successful coil embolization of the distal segment of the gastroduodenal artery including the origin of the injured arterial branch. Signed, Sterling Big, MD, RPVI [facility] Radiology Electronically Signed   By: Malachy Moan M.D.   On: 05/18/2018 17:17    Discharge Exam: Vitals:   05/22/18 2113 05/23/18 0506  BP: 120/60 103/64  Pulse: 90 87  Resp: 18 15  Temp: 97.6 F  (36.4 C) 98 F (36.7 C)  SpO2: 100% 100%   Vitals:   05/22/18 0450 05/22/18 1358 05/22/18 2113 05/23/18 0506  BP: 115/61 133/66 120/60 103/64  Pulse: 93 98 90 87  Resp: 19 20 18 15   Temp: 98.2 F (36.8 C) (!) 97.5 F (36.4 C) 97.6 F (36.4 C) 98 F (36.7 C)  TempSrc: Oral  Oral Oral  SpO2: 99% 100% 100% 100%  Weight:   58.5 kg  Height:        General: Pt is alert, awake, not in acute distress Cardiovascular: RRR, S1/S2 +, no rubs, no gallops Respiratory: CTA bilaterally, no wheezing, no rhonchi Abdominal: Soft, NT, ND, bowel sounds + Extremities: no edema, no cyanosis   The results of significant diagnostics from this hospitalization (including imaging, microbiology, ancillary and laboratory) are listed below for reference.     Microbiology: Recent Results (from the past 240 hour(s))  MRSA PCR Screening     Status: None   Collection Time: 05/18/18  1:17 PM  Result Value Ref Range Status   MRSA by PCR NEGATIVE NEGATIVE Final    Comment:        The GeneXpert MRSA Assay (FDA approved for NASAL specimens only), is one component of a comprehensive MRSA colonization surveillance program. It is not intended to diagnose MRSA infection nor to guide or monitor treatment for MRSA infections. Performed at [facility], [address] [place], [place] [address]      Labs: BNP (last 3 results) No results for input(s): BNP in the last 8760 hours. Basic Metabolic Panel: Recent Labs  Lab 05/18/18 0827 05/18/18 0840 05/19/18 0344  NA 142  --  141  K 2.9*  --  3.6  CL 104  --  109  CO2 28  --  24  GLUCOSE 111*  --  110*  BUN 18  --  12  CREATININE 0.94  --  0.63  CALCIUM 8.7*  --  8.2*  MG  --  2.2  --    Liver Function Tests: Recent Labs  Lab 05/18/18 0827 05/19/18 0344  AST 47* 19  ALT 34 20  ALKPHOS 137* 85  BILITOT 0.6 0.6  PROT 6.5 5.1*  ALBUMIN 3.4* 2.6*   No results for input(s): LIPASE, AMYLASE in the last 168 hours. No  results for input(s): AMMONIA in the last 168 hours. CBC: Recent Labs  Lab 05/18/18 0827  05/19/18 0344  05/20/18 0653 05/20/18 1330 05/20/18 2005 05/21/18 0112 05/21/18 0641  WBC 8.2  --  6.5  --   --   --   --   --   --   NEUTROABS 6.4  --   --   --   --   --   --   --   --   HGB 7.3*   < > 7.4*   < > 7.3* 8.5* 8.6* 9.0* 9.1*  HCT 23.8*   < > 23.2*   < > 22.8* 25.5* 26.0* 27.5* 27.4*  MCV 82.6  --  86.9  --   --   --   --   --   --   PLT 341  --  201  --   --   --   --   --   --    < > = values in this interval not displayed.   Cardiac Enzymes: No results for input(s): CKTOTAL, CKMB, CKMBINDEX, TROPONINI in the last 168 hours. BNP: Invalid input(s): POCBNP CBG: No results for input(s): GLUCAP in the last 168 hours. D-Dimer No results for input(s): DDIMER in the last 72 hours. Hgb A1c No results for input(s): HGBA1C in the last 72 hours. Lipid Profile No results for input(s): CHOL, HDL, LDLCALC, TRIG, CHOLHDL, LDLDIRECT in the last 72 hours. Thyroid function studies No results for input(s): TSH, T4TOTAL, T3FREE, THYROIDAB in the last 72 hours.  Invalid input(s): FREET3 Anemia work up No results for input(s): VITAMINB12, FOLATE, FERRITIN, TIBC,  IRON, RETICCTPCT in the last 72 hours. Urinalysis    Component Value Date/Time   COLORURINE YELLOW 04/24/2018 0940   APPEARANCEUR CLEAR 04/24/2018 0940   LABSPEC 1.005 04/24/2018 0940   PHURINE 7.0 04/24/2018 0940   GLUCOSEU NEGATIVE 04/24/2018 0940   HGBUR NEGATIVE 04/24/2018 0940   BILIRUBINUR NEGATIVE 04/24/2018 0940   KETONESUR NEGATIVE 04/24/2018 0940   PROTEINUR NEGATIVE 04/24/2018 0940   NITRITE NEGATIVE 04/24/2018 0940   LEUKOCYTESUR NEGATIVE 04/24/2018 0940   Sepsis Labs Invalid input(s): PROCALCITONIN,  WBC,  LACTICIDVEN Microbiology Recent Results (from the past 240 hour(s))  MRSA PCR Screening     Status: None   Collection Time: 05/18/18  1:17 PM  Result Value Ref Range Status   MRSA by PCR NEGATIVE  NEGATIVE Final    Comment:        The GeneXpert MRSA Assay (FDA approved for NASAL specimens only), is one component of a comprehensive MRSA colonization surveillance program. It is not intended to diagnose MRSA infection nor to guide or monitor treatment for MRSA infections. Performed at [facility], [address], [place], [place] [address]     Time coordinating discharge: 32 minutes  SIGNED:  Latrelle Dodrill, MD  [facility] 05/23/2018, 11:44 AM  Pager please text page via  [email or web address]  Note - This record has been created using [employer]. Chart creation errors have been sought, but may not always have been located. Such creation errors do not reflect on the standard of medical care.

## 2018-05-23 NOTE — Progress Notes (Signed)
Pt slept well during the night has awakened & dressed in street clothes stating he is leaving at 9am today - he has arranged a ride with [employer] and has a plan to go to [place] in 2 weeks.

## 2019-11-25 IMAGING — US US THYROID
2 series · 14 of 25 positions shown · non-contrast
Comparison: None.

CLINICAL DATA: Hyperthyroid.  Low TSH.

EXAM:
THYROID ULTRASOUND
TECHNIQUE: Ultrasound examination of the thyroid gland and adjacent soft
tissues was performed.

[Series 1: us thyroid · 13 of 48 slices shown (1 of 2)]
[im 1/48]
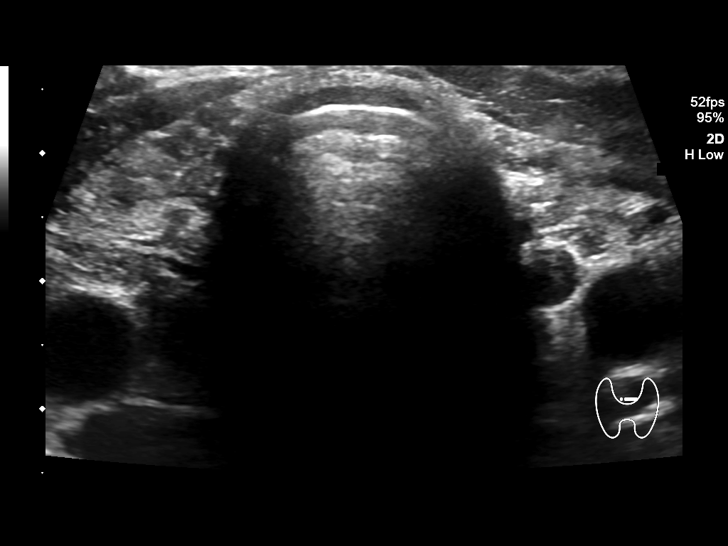
[im 5/48]
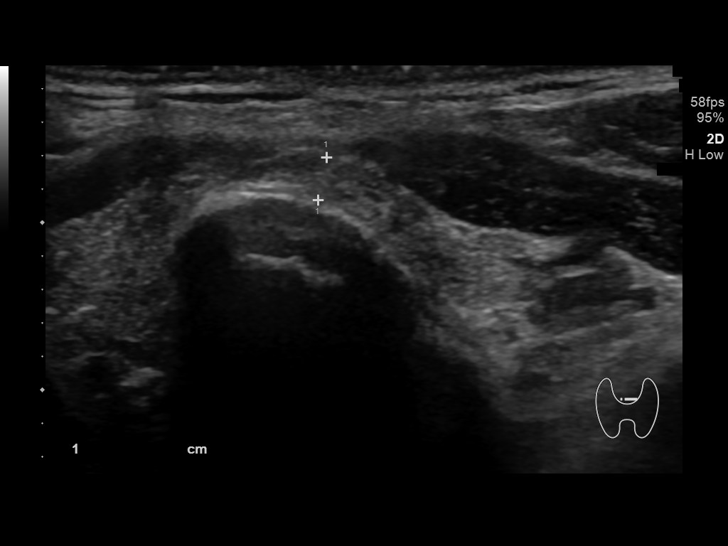
[im 9/48]
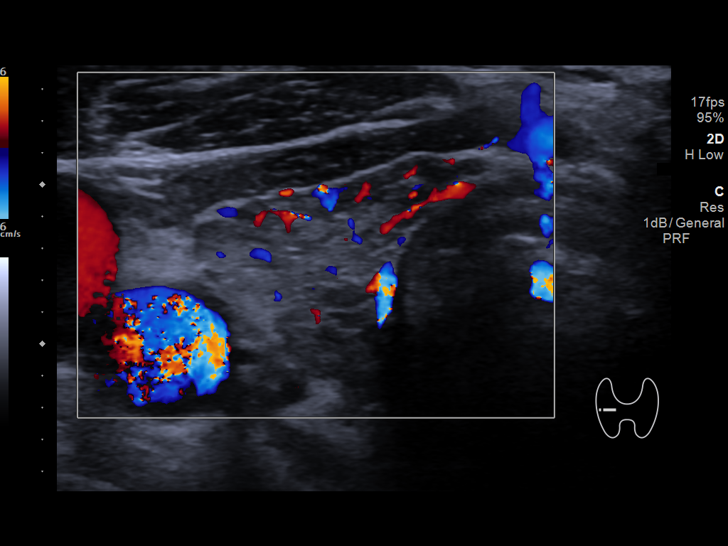
[im 13/48]
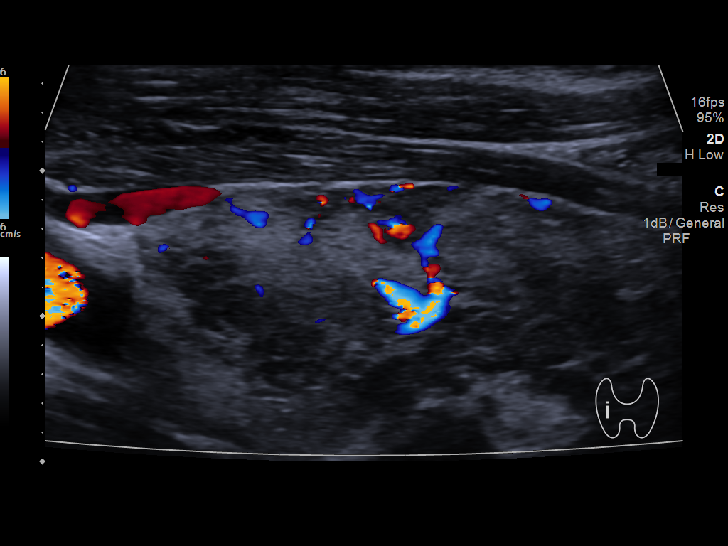
[im 17/48]
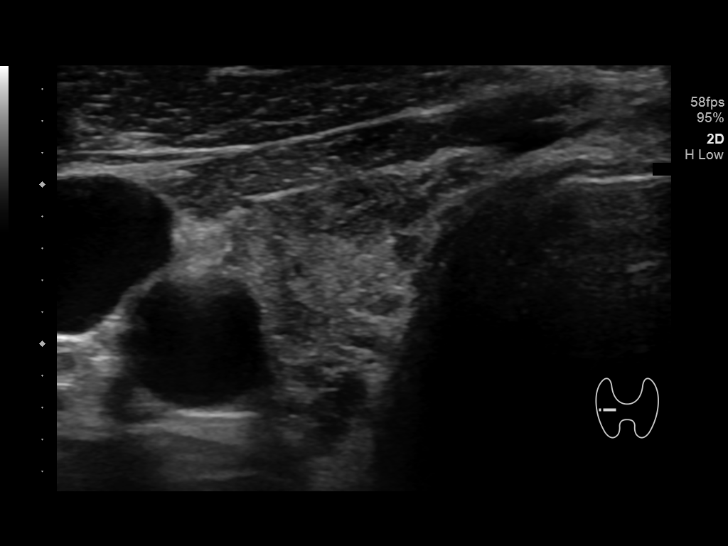
[im 19/48]
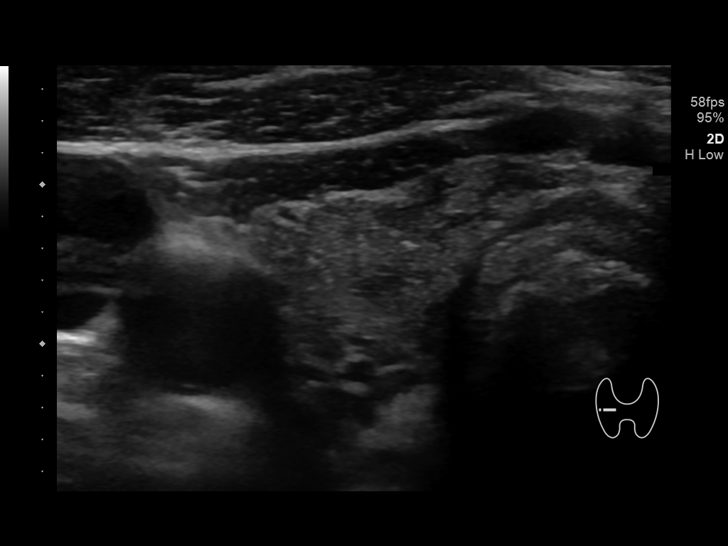
[im 23/48]
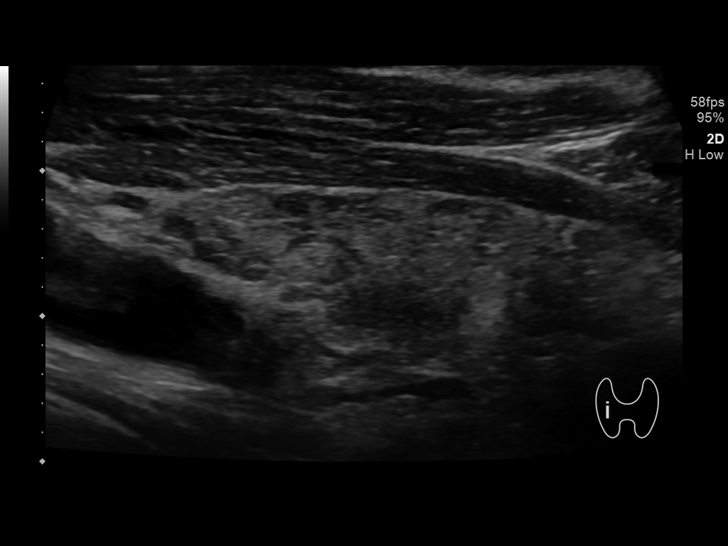
[im 27/48]
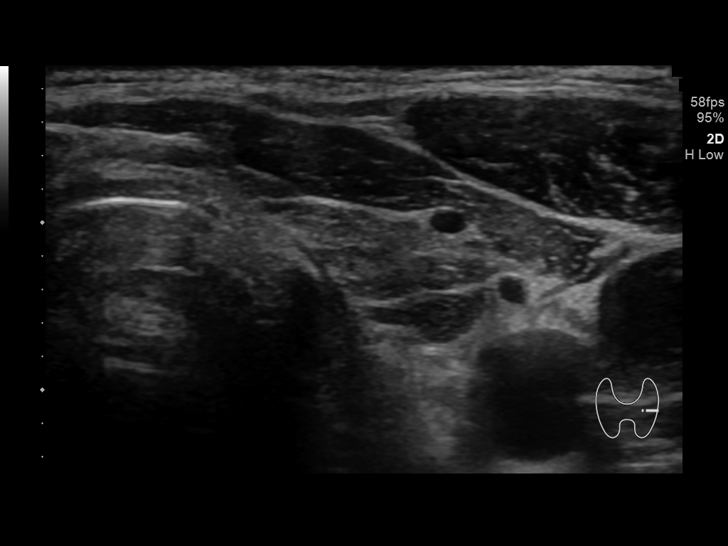
[im 31/48]
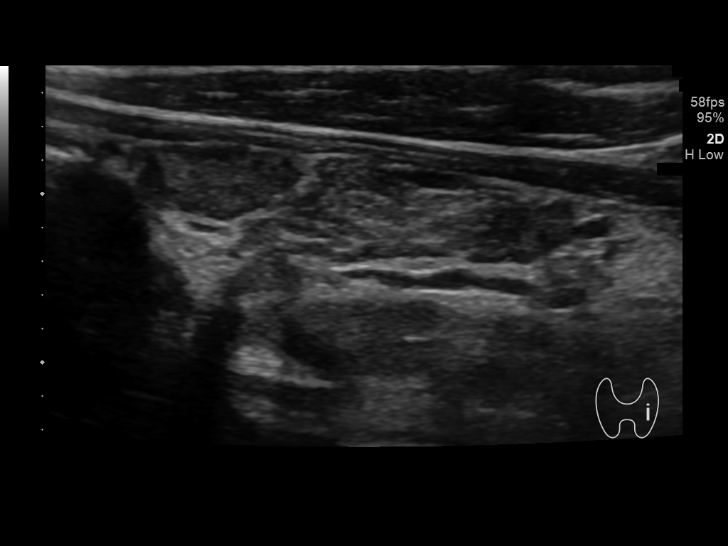
[im 33/48]
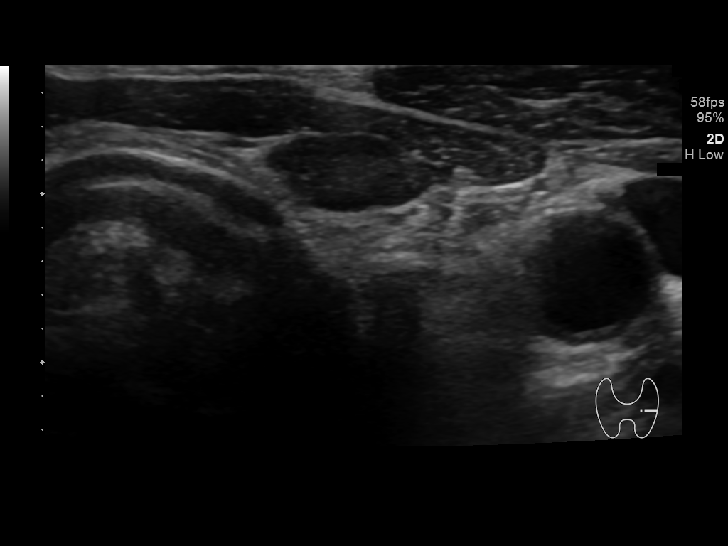
[im 37/48]
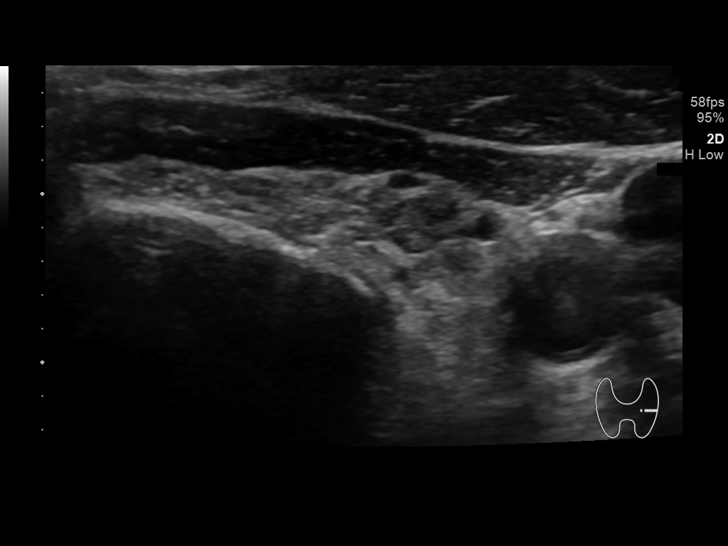
[im 41/48]
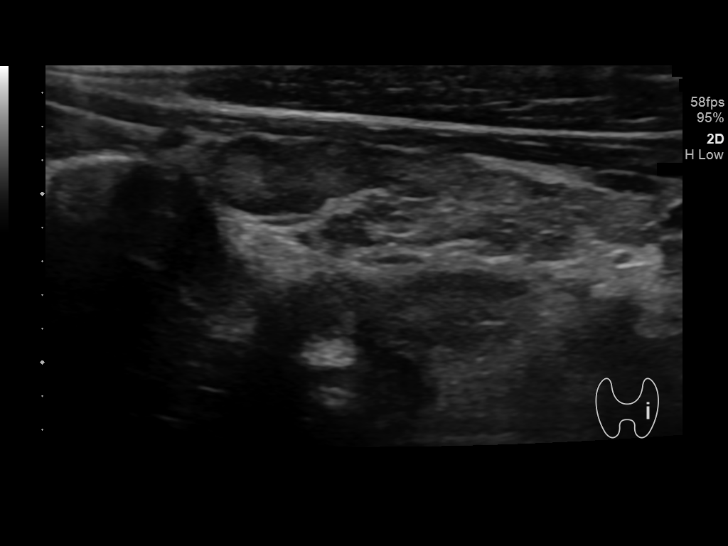
[im 45/48]
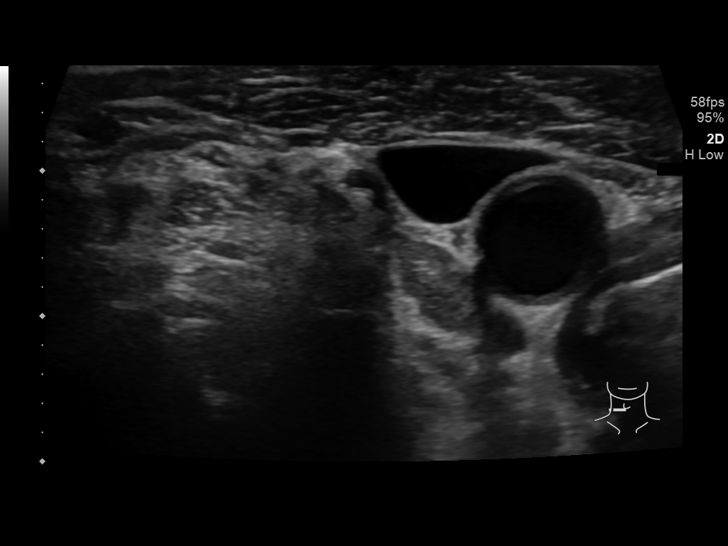

[Series 1001: us thyroid · 1 of 1 slices shown (2 of 2)]
[im 1/1]
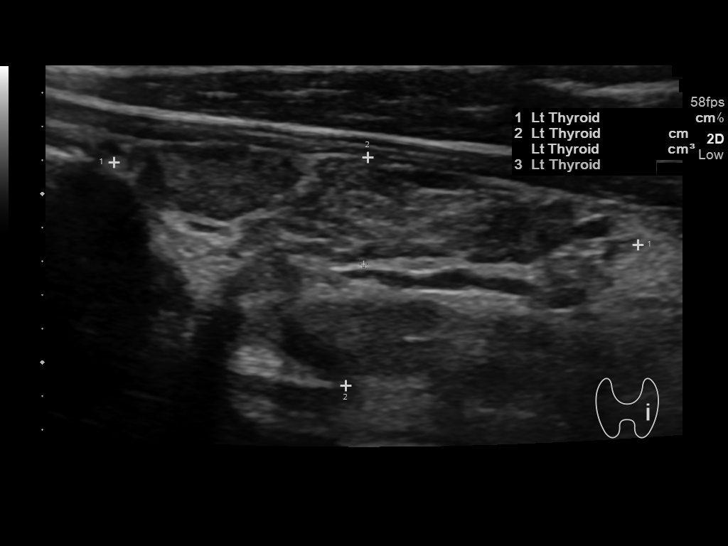

[14 of 25 positions shown; findings below may reference images not displayed]

FINDINGS: Parenchymal Echotexture: Markedly heterogenous

Isthmus: 0.3 cm

Right lobe: 4.0 x 1.7 x 1.3 cm

Left lobe: 3.2 x 1.4 x 1.1 cm

_________________________________________________________

Estimated total number of nodules >/= 1 cm: 0

Number of spongiform nodules >/=  2 cm not described below (TR1): 0

Number of mixed cystic and solid nodules >/= 1.5 cm not described
below (TR2): 0

_________________________________________________________

The thyroid gland is diffusely heterogeneous. No focal nodules
identified. No abnormal lymph nodes seen by ultrasound.
IMPRESSION: Diffusely heterogeneous thyroid gland without evidence of focal
nodules.

The above is in keeping with the ACR TI-RADS recommendations - [HOSPITAL] 5613;[DATE].

## 2019-12-14 IMAGING — XA IR ANGIO/ADD [PERSON_NAME]
12 of 23 series · 12 of 24 positions shown · IV contrast (IODINE)
Comparison: none

EXAM:
SELECTIVE VISCERAL ARTERIOGRAPHY; ADDITIONAL ARTERIOGRAPHY; IR DOO HO
KARCI BETUL HEMORR LYMPH EXTRAV INC GUIDE ROADMAPPING; IR ULTRASOUND
GUIDANCE VASC ACCESS RIGHT

MEDICATIONS:
None
ANESTHESIA/SEDATION:
Moderate (conscious) sedation was employed during this procedure. A
total of Versed 1.5 mg and Fentanyl 50 mcg was administered
intravenously.
Moderate Sedation Time: 62 minutes. The patient's level of
consciousness and vital signs were monitored continuously by
radiology nursing throughout the procedure under my direct
supervision.
CONTRAST:  20mL W5OKI0-XXX IOPAMIDOL (W5OKI0-XXX) INJECTION 61%,
70mL [id number]-XXX IOPAMIDOL (W5OKI0-XXX) INJECTION 61%
FLUOROSCOPY TIME:  Fluoroscopy Time: 20 minutes 0 seconds (2423
mGy).
COMPLICATIONS:
None immediate.
PROCEDURE:
Informed consent was obtained from the patient following explanation
of the procedure, risks, benefits and alternatives. The patient
understands, agrees and consents for the procedure. All questions
were addressed. A time out was performed prior to the initiation of
the procedure. Maximal barrier sterile technique utilized including
caps, mask, sterile gowns, sterile gloves, large sterile drape, hand
hygiene, and Betadine prep.
The right common femoral artery was interrogated with ultrasound and
found to be widely patent. An image was obtained and stored for the
medical record. Local anesthesia was attained by infiltration with
1% lidocaine. A small dermatotomy was made. Under real-time
sonographic guidance, the vessel was punctured with a 21 gauge
micropuncture needle. Using standard technique, the initial micro
needle was exchanged over a 0.018 micro wire for a transitional 4
French micro sheath. The micro sheath was then exchanged over a
0.035 wire for a 5 French vascular sheath.
A C2 cobra catheter was advanced over a Bentson wire into the
abdominal aorta. Despite multiple attempts, the origin of the celiac
artery could not be catheterized. The origin of the superior
mesenteric artery was catheterized. Arteriography was performed. The
inferior pancreaticoduodenal arcade is hypertrophic and provides
robust flow retrograde up the gastroduodenal arteries supplying the
right a proper hepatic artery. This is an indication that the origin
of the celiac artery is occluded. There is a small vessel stump in
the region of the inferior genu of the gastroduodenal artery
concerning for and injured/recently bleeding artery.
A renegade ST microcatheter was advanced over a Fathom 16 wire into
a branch off the superior mesenteric artery. Arteriography
demonstrates anatomy most consistent with the middle colic artery.
There is no communication with the gastroduodenal artery. The
microcatheter was brought back into the main superior mesenteric
artery. The catheter was then successfully navigated into the
inferior pancreaticoduodenal artery. Arteriography was performed in
multiple obliquities confirming the anatomy of the inferior
pancreatic 0 duodenal artery, its anastomosis with the right
gastroepiploic artery and right gastroduodenal artery. Again, there
is a small beak of an artery consistent with an injured vessel. This
appears to be in the expected location of the descending duodenum.
The microcatheter was successfully navigated into the gastroduodenal
artery. Contrast extravasation in the region of the abnormal branch
artery demonstrates active extravasation. The microcatheter was
successfully navigated beyond the injured artery into the
gastroduodenal artery more proximal to the proper hepatic artery.
Coil embolization was then performed of the distal segment of the
gastroduodenal artery across the origin of the injured arterial
branch vessel using a series of [DATE] and 5 mm fibered and non
fibered interlock detachable microcoils. Care was taken to make sure
the coil pack did not extend across the origin of the right
gastroepiploic artery. Post embolization arteriography demonstrates
complete cessation of flow within that segment of the gastroduodenal
artery, no further extravasation from the injured artery and
preservation of flow through the gastroepiploic artery.
The microcatheter and 5 French catheters were removed. A limited
right common femoral arteriogram was performed confirming common
femoral arterial access. Of note, the patient has a high
bifurcation. Hemostasis was attained with the assistance of a 5
French Cordis ExoSeal extra arterial vascular plug.

[Series 2: care body 4 · 1 of 35 frames shown (1 of 10)]
[frame 6/35]
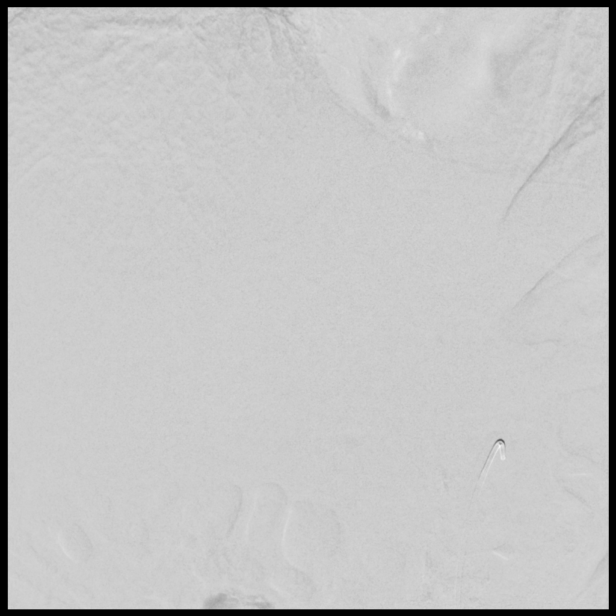

[Series 4: care body 4 · 1 of 25 frames shown (2 of 10)]
[frame 4/25]
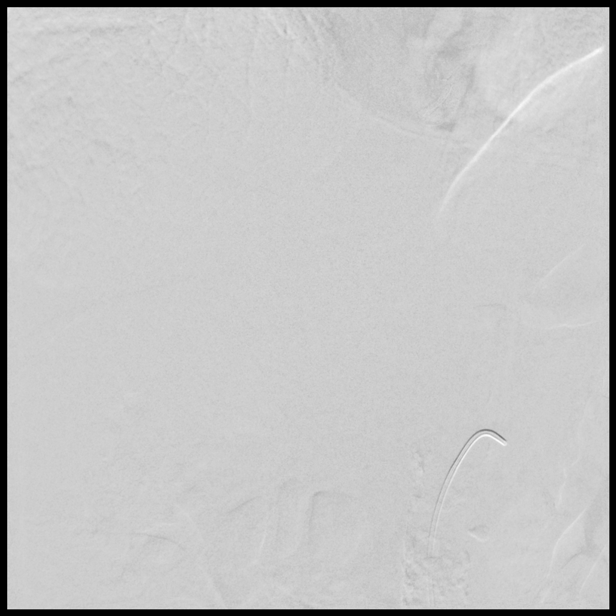

[Series 6: care body 4 · 1 of 24 frames shown (3 of 10)]
[frame 4/24]
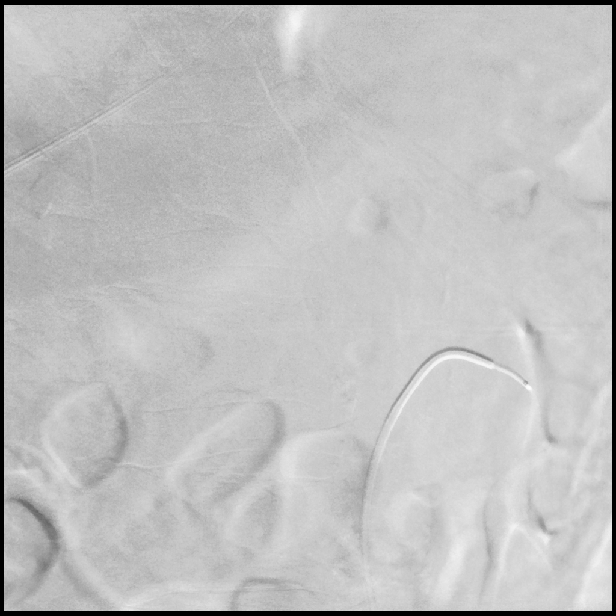

[Series 7: care body 4 · 1 of 19 frames shown (4 of 10)]
[frame 17/19]
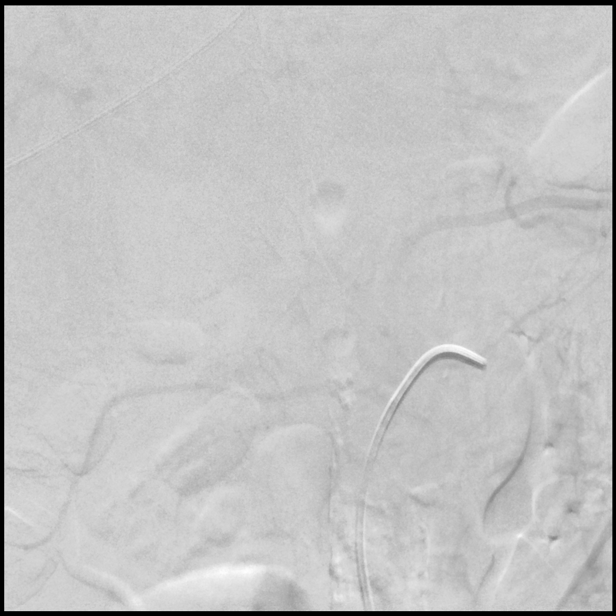

[Series 9: care body 4 · 1 of 22 frames shown (5 of 10)]
[frame 19/22]
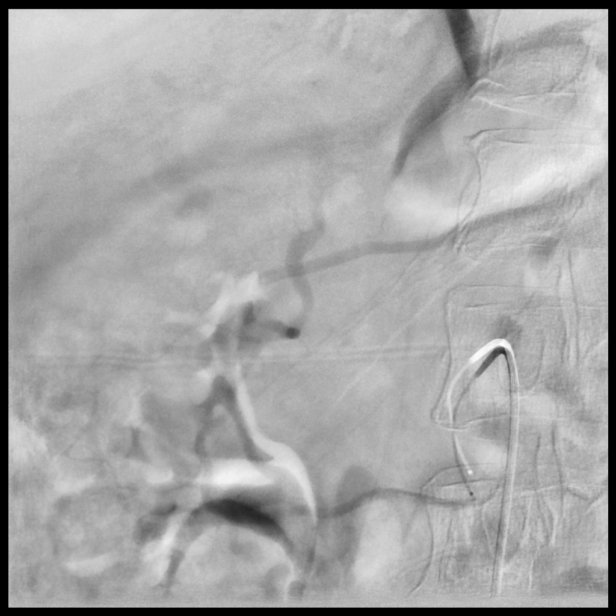

[Series 11: care body 4 · 1 of 21 frames shown (6 of 10)]
[frame 18/21]
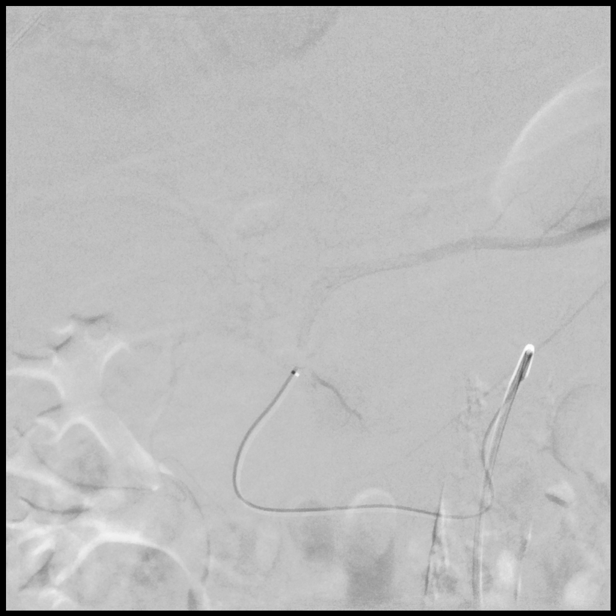

[Series 14: care body 4 · 1 of 15 frames shown (7 of 10)]
[frame 8/15]
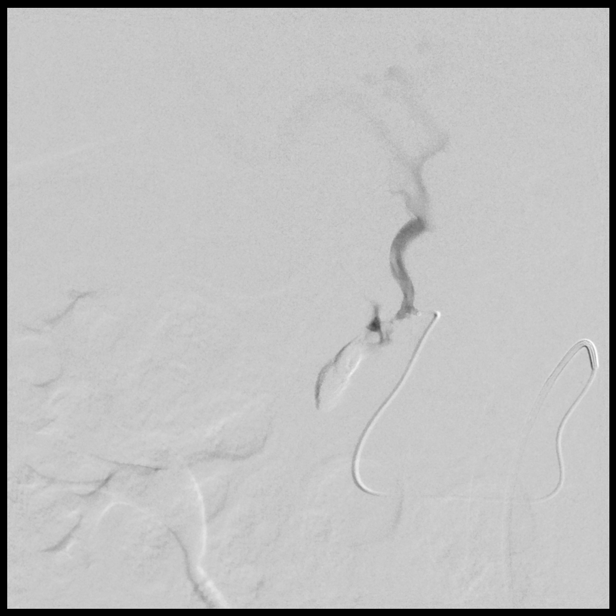

[Series 16: care body 4 · 1 of 17 frames shown (8 of 10)]
[frame 9/17]
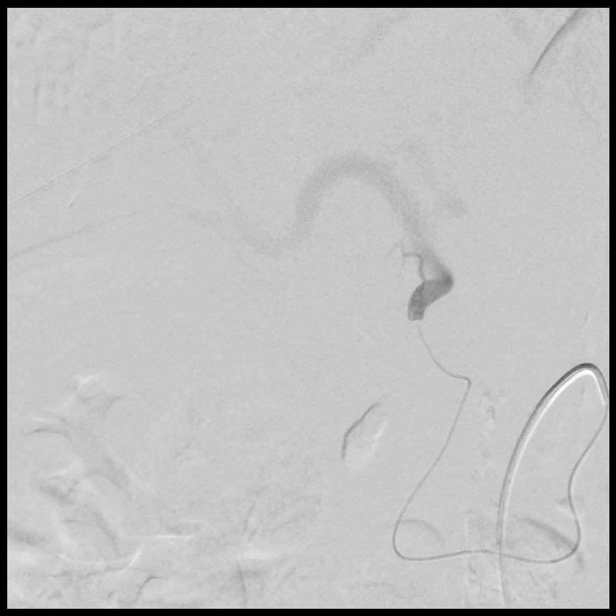

[Series 18: care body 4 · 1 of 30 frames shown (9 of 10)]
[frame 12/30]
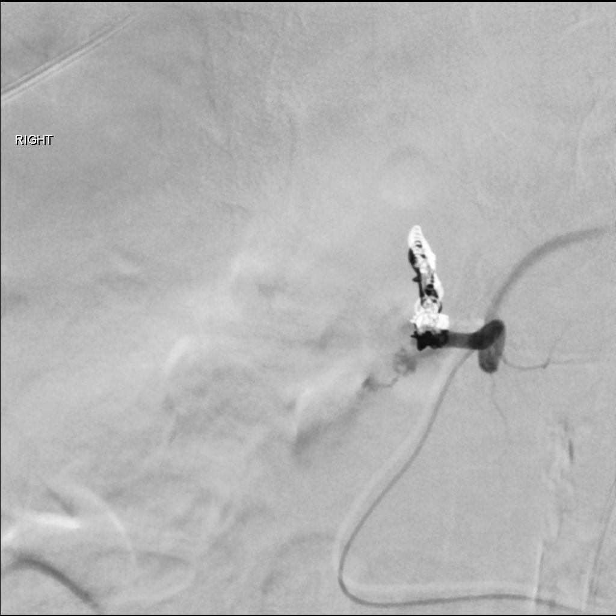

[Series 20: care body 4 · 1 of 33 frames shown (10 of 10)]
[frame 5/33]
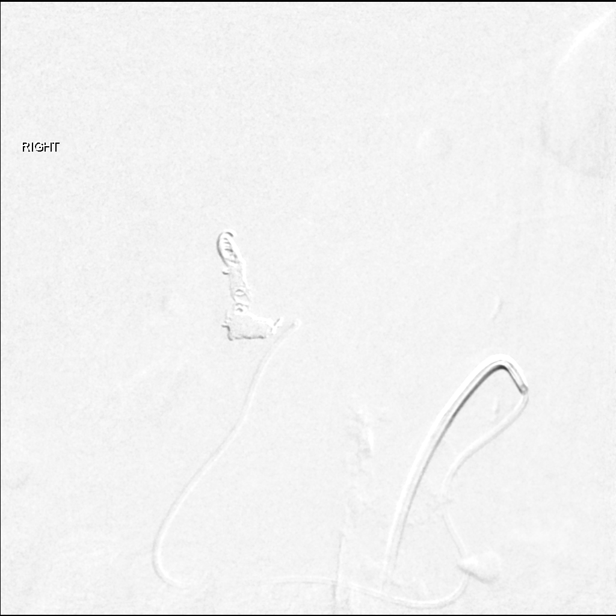

[Series 22: fl - angio · 1 of 20 frames shown]
[frame 4/20]
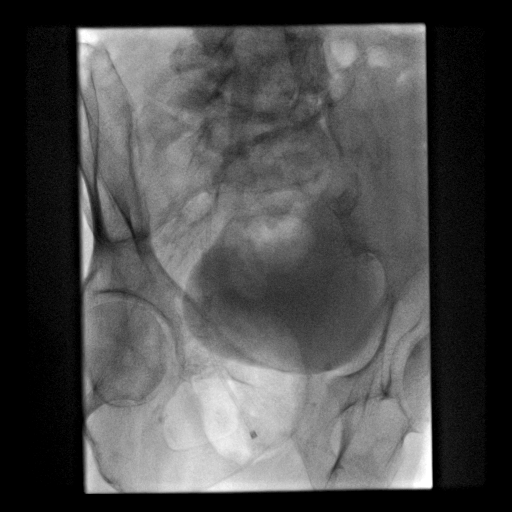

[Series 300: ld dsa body · 1 of 1 slices shown]
[im 1/1]
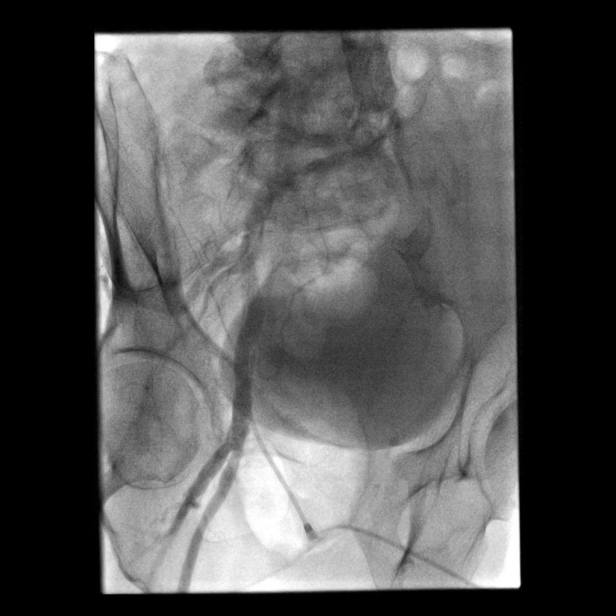

[12 of 24 positions shown; findings below may reference images not displayed]

IMPRESSION: 1. Evidence of arterial injury/active bleeding arising from a branch
vessel from the distal gastroduodenal artery.
2. Successful coil embolization of the distal segment of the
gastroduodenal artery including the origin of the injured arterial
branch.

## 2021-03-08 DEATH — deceased
